# Patient Record
Sex: Male | Born: 1975 | Race: White | Hispanic: No | Smoking: Current every day smoker
Health system: Southern US, Community
[De-identification: ages and names within clinical notes are randomized; demographics above are authoritative.]

## PROBLEM LIST (undated history)

## (undated) DIAGNOSIS — Z720 Tobacco use: Secondary | ICD-10-CM

## (undated) DIAGNOSIS — F1910 Other psychoactive substance abuse, uncomplicated: Secondary | ICD-10-CM

## (undated) DIAGNOSIS — F1010 Alcohol abuse, uncomplicated: Secondary | ICD-10-CM

## (undated) DIAGNOSIS — J439 Emphysema, unspecified: Secondary | ICD-10-CM

## (undated) HISTORY — PX: HERNIA REPAIR: SHX51

---

## 2005-10-23 ENCOUNTER — Emergency Department: Payer: Self-pay | Admitting: Emergency Medicine

## 2008-08-01 ENCOUNTER — Ambulatory Visit: Payer: Self-pay | Admitting: Surgery

## 2008-08-07 ENCOUNTER — Ambulatory Visit: Payer: Self-pay | Admitting: Surgery

## 2014-01-25 ENCOUNTER — Emergency Department: Payer: Self-pay | Admitting: Student

## 2014-01-25 LAB — COMPREHENSIVE METABOLIC PANEL
ALK PHOS: 99 U/L
Albumin: 4.3 g/dL (ref 3.4–5.0)
Anion Gap: 10 (ref 7–16)
BUN: 9 mg/dL (ref 7–18)
Bilirubin,Total: 2.1 mg/dL — ABNORMAL HIGH (ref 0.2–1.0)
CALCIUM: 8.9 mg/dL (ref 8.5–10.1)
CHLORIDE: 102 mmol/L (ref 98–107)
Co2: 23 mmol/L (ref 21–32)
Creatinine: 0.74 mg/dL (ref 0.60–1.30)
EGFR (Non-African Amer.): >60
Glucose: 126 mg/dL — ABNORMAL HIGH (ref 65–99)
OSMOLALITY: 270 (ref 275–301)
POTASSIUM: 4.1 mmol/L (ref 3.5–5.1)
SGOT(AST): 125 U/L — ABNORMAL HIGH (ref 15–37)
SGPT (ALT): 197 U/L — ABNORMAL HIGH
SODIUM: 135 mmol/L — AB (ref 136–145)
TOTAL PROTEIN: 7.6 g/dL (ref 6.4–8.2)

## 2014-01-25 LAB — DRUG SCREEN, URINE
Amphetamines, Ur Screen: NEGATIVE (ref ?–1000)
BENZODIAZEPINE, UR SCRN: POSITIVE (ref ?–200)
Barbiturates, Ur Screen: NEGATIVE (ref ?–200)
Cannabinoid 50 Ng, Ur ~~LOC~~: POSITIVE (ref ?–50)
Cocaine Metabolite,Ur ~~LOC~~: NEGATIVE (ref ?–300)
MDMA (Ecstasy)Ur Screen: POSITIVE (ref ?–500)
METHADONE, UR SCREEN: NEGATIVE (ref ?–300)
OPIATE, UR SCREEN: NEGATIVE (ref ?–300)
Phencyclidine (PCP) Ur S: NEGATIVE (ref ?–25)
TRICYCLIC, UR SCREEN: NEGATIVE (ref ?–1000)

## 2014-01-25 LAB — SALICYLATE LEVEL: Salicylates, Serum: 3.4 mg/dL — ABNORMAL HIGH

## 2014-01-25 LAB — URINALYSIS, COMPLETE
BACTERIA: NONE SEEN
BILIRUBIN, UR: NEGATIVE
Blood: NEGATIVE
Glucose,UR: NEGATIVE mg/dL (ref 0–75)
LEUKOCYTE ESTERASE: NEGATIVE
Nitrite: NEGATIVE
PROTEIN: NEGATIVE
Ph: 7 (ref 4.5–8.0)
RBC, UR: NONE SEEN /HPF (ref 0–5)
Specific Gravity: 1.013 (ref 1.003–1.030)
WBC UR: NONE SEEN /HPF (ref 0–5)

## 2014-01-25 LAB — CBC
HCT: 45.3 % (ref 40.0–52.0)
HGB: 14.6 g/dL (ref 13.0–18.0)
MCH: 28.4 pg (ref 26.0–34.0)
MCHC: 32.2 g/dL (ref 32.0–36.0)
MCV: 88 fL (ref 80–100)
PLATELETS: 149 10*3/uL — AB (ref 150–440)
RBC: 5.13 10*6/uL (ref 4.40–5.90)
RDW: 14.5 % (ref 11.5–14.5)
WBC: 4.3 10*3/uL (ref 3.8–10.6)

## 2014-01-25 LAB — ETHANOL: Ethanol: <3 mg/dL

## 2014-01-25 LAB — ACETAMINOPHEN LEVEL: Acetaminophen: <2 ug/mL

## 2018-05-03 ENCOUNTER — Emergency Department: Payer: Self-pay

## 2018-05-03 ENCOUNTER — Inpatient Hospital Stay
Admission: EM | Admit: 2018-05-03 | Discharge: 2018-05-08 | DRG: 177 | Disposition: A | Discharge status: Home / Self Care | Payer: Self-pay | Attending: Specialist | Admitting: Specialist

## 2018-05-03 ENCOUNTER — Other Ambulatory Visit: Payer: Self-pay

## 2018-05-03 DIAGNOSIS — E43 Unspecified severe protein-calorie malnutrition: Secondary | ICD-10-CM | POA: Diagnosis present

## 2018-05-03 DIAGNOSIS — Z681 Body mass index (BMI) 19 or less, adult: Secondary | ICD-10-CM

## 2018-05-03 DIAGNOSIS — Z716 Tobacco abuse counseling: Secondary | ICD-10-CM

## 2018-05-03 DIAGNOSIS — Z23 Encounter for immunization: Secondary | ICD-10-CM

## 2018-05-03 DIAGNOSIS — F1290 Cannabis use, unspecified, uncomplicated: Secondary | ICD-10-CM | POA: Diagnosis present

## 2018-05-03 DIAGNOSIS — Z8249 Family history of ischemic heart disease and other diseases of the circulatory system: Secondary | ICD-10-CM

## 2018-05-03 DIAGNOSIS — E876 Hypokalemia: Secondary | ICD-10-CM | POA: Diagnosis present

## 2018-05-03 DIAGNOSIS — F10239 Alcohol dependence with withdrawal, unspecified: Secondary | ICD-10-CM | POA: Diagnosis present

## 2018-05-03 DIAGNOSIS — J189 Pneumonia, unspecified organism: Secondary | ICD-10-CM | POA: Diagnosis present

## 2018-05-03 DIAGNOSIS — F329 Major depressive disorder, single episode, unspecified: Secondary | ICD-10-CM | POA: Diagnosis present

## 2018-05-03 DIAGNOSIS — J690 Pneumonitis due to inhalation of food and vomit: Principal | ICD-10-CM | POA: Diagnosis present

## 2018-05-03 DIAGNOSIS — L89010 Pressure ulcer of right elbow, unstageable: Secondary | ICD-10-CM | POA: Diagnosis present

## 2018-05-03 DIAGNOSIS — F17200 Nicotine dependence, unspecified, uncomplicated: Secondary | ICD-10-CM | POA: Diagnosis present

## 2018-05-03 DIAGNOSIS — Z833 Family history of diabetes mellitus: Secondary | ICD-10-CM

## 2018-05-03 DIAGNOSIS — L8990 Pressure ulcer of unspecified site, unspecified stage: Secondary | ICD-10-CM

## 2018-05-03 DIAGNOSIS — D649 Anemia, unspecified: Secondary | ICD-10-CM | POA: Diagnosis present

## 2018-05-03 HISTORY — DX: Tobacco use: Z72.0

## 2018-05-03 HISTORY — DX: Alcohol abuse, uncomplicated: F10.10

## 2018-05-03 HISTORY — DX: Other psychoactive substance abuse, uncomplicated: F19.10

## 2018-05-03 LAB — URINALYSIS, COMPLETE (UACMP) WITH MICROSCOPIC
Bacteria, UA: NONE SEEN
Bilirubin Urine: NEGATIVE
Glucose, UA: NEGATIVE mg/dL
Hgb urine dipstick: NEGATIVE
KETONES UR: NEGATIVE mg/dL
LEUKOCYTES UA: NEGATIVE
Nitrite: NEGATIVE
Protein, ur: NEGATIVE mg/dL
Specific Gravity, Urine: 1.011 (ref 1.005–1.030)
pH: 7 (ref 5.0–8.0)

## 2018-05-03 LAB — COMPREHENSIVE METABOLIC PANEL
ALBUMIN: 3 g/dL — AB (ref 3.5–5.0)
ALT: 22 U/L (ref 0–44)
AST: 29 U/L (ref 15–41)
Alkaline Phosphatase: 95 U/L (ref 38–126)
Anion gap: 13 (ref 5–15)
BUN: <5 mg/dL — ABNORMAL LOW (ref 6–20)
CO2: 28 mmol/L (ref 22–32)
Calcium: 8.5 mg/dL — ABNORMAL LOW (ref 8.9–10.3)
Chloride: 88 mmol/L — ABNORMAL LOW (ref 98–111)
Creatinine, Ser: 0.61 mg/dL (ref 0.61–1.24)
GFR calc Af Amer: >60 mL/min (ref >60)
GFR calc non Af Amer: >60 mL/min (ref >60)
Glucose, Bld: 121 mg/dL — ABNORMAL HIGH (ref 70–99)
Potassium: 3.6 mmol/L (ref 3.5–5.1)
Sodium: 129 mmol/L — ABNORMAL LOW (ref 135–145)
Total Bilirubin: 1.6 mg/dL — ABNORMAL HIGH (ref 0.3–1.2)
Total Protein: 7.1 g/dL (ref 6.5–8.1)

## 2018-05-03 LAB — URINE DRUG SCREEN, QUALITATIVE (ARMC ONLY)
AMPHETAMINES, UR SCREEN: NOT DETECTED
Barbiturates, Ur Screen: NOT DETECTED
Benzodiazepine, Ur Scrn: NOT DETECTED
Cannabinoid 50 Ng, Ur ~~LOC~~: NOT DETECTED
Cocaine Metabolite,Ur ~~LOC~~: NOT DETECTED
MDMA (Ecstasy)Ur Screen: NOT DETECTED
Methadone Scn, Ur: NOT DETECTED
Opiate, Ur Screen: NOT DETECTED
Phencyclidine (PCP) Ur S: NOT DETECTED
Tricyclic, Ur Screen: NOT DETECTED

## 2018-05-03 LAB — TSH: TSH: 3.008 u[IU]/mL (ref 0.350–4.500)

## 2018-05-03 LAB — CBC
HCT: 37.1 % — ABNORMAL LOW (ref 39.0–52.0)
Hemoglobin: 12.5 g/dL — ABNORMAL LOW (ref 13.0–17.0)
MCH: 28.9 pg (ref 26.0–34.0)
MCHC: 33.7 g/dL (ref 30.0–36.0)
MCV: 85.7 fL (ref 80.0–100.0)
Platelets: 615 10*3/uL — ABNORMAL HIGH (ref 150–400)
RBC: 4.33 MIL/uL (ref 4.22–5.81)
RDW: 13.5 % (ref 11.5–15.5)
WBC: 12.1 10*3/uL — ABNORMAL HIGH (ref 4.0–10.5)
nRBC: 0 % (ref 0.0–0.2)

## 2018-05-03 LAB — CG4 I-STAT (LACTIC ACID)
Lactic Acid, Venous: 3.49 mmol/L (ref 0.5–1.9)
Lactic Acid, Venous: 2.03 mmol/L (ref 0.5–1.9)

## 2018-05-03 LAB — MRSA PCR SCREENING: MRSA by PCR: NEGATIVE

## 2018-05-03 LAB — TROPONIN I: Troponin I: <0.03 ng/mL (ref <0.03)

## 2018-05-03 LAB — VITAMIN B12: Vitamin B-12: 1409 pg/mL — ABNORMAL HIGH (ref 180–914)

## 2018-05-03 LAB — PROCALCITONIN: Procalcitonin: 2.67 ng/mL

## 2018-05-03 MED ORDER — SODIUM CHLORIDE 0.9 % IV SOLN: 3.0000 g | Freq: Once | INTRAVENOUS | Status: DC

## 2018-05-03 MED ORDER — LORAZEPAM 2 MG/ML IJ SOLN: 0.0000 mg | Freq: Four times a day (QID) | INTRAMUSCULAR | Status: DC

## 2018-05-03 MED ORDER — THIAMINE HCL 100 MG/ML IJ SOLN: 100.0000 mg | Freq: Every day | INTRAMUSCULAR | Status: DC

## 2018-05-03 MED ORDER — VANCOMYCIN HCL IN DEXTROSE 1-5 GM/200ML-% IV SOLN
1000.0000 mg | Freq: Two times a day (BID) | INTRAVENOUS | Status: DC
  Administered 2018-05-04: 03:00:00 1000 mg via INTRAVENOUS
  Filled 2018-05-03 (×2): qty 200

## 2018-05-03 MED ORDER — LORAZEPAM 2 MG/ML IJ SOLN: 0.0000 mg | Freq: Two times a day (BID) | INTRAMUSCULAR | Status: DC

## 2018-05-03 MED ORDER — DOCUSATE SODIUM 100 MG PO CAPS
100.0000 mg | ORAL_CAPSULE | Freq: Two times a day (BID) | ORAL | Status: DC
  Administered 2018-05-04 – 2018-05-08 (×9): 100 mg via ORAL
  Filled 2018-05-03 (×9): qty 1

## 2018-05-03 MED ORDER — KETOROLAC TROMETHAMINE 30 MG/ML IJ SOLN
30.0000 mg | Freq: Four times a day (QID) | INTRAMUSCULAR | Status: DC | PRN
  Administered 2018-05-05 (×2): 30 mg via INTRAVENOUS
  Filled 2018-05-03 (×2): qty 1

## 2018-05-03 MED ORDER — ZOLPIDEM TARTRATE 5 MG PO TABS
5.0000 mg | ORAL_TABLET | Freq: Every evening | ORAL | Status: DC | PRN
  Administered 2018-05-03 – 2018-05-06 (×4): 5 mg via ORAL
  Filled 2018-05-03 (×5): qty 1

## 2018-05-03 MED ORDER — IOHEXOL 300 MG/ML  SOLN
75.0000 mL | Freq: Once | INTRAMUSCULAR | Status: AC | PRN
  Administered 2018-05-03: 75 mL via INTRAVENOUS

## 2018-05-03 MED ORDER — SODIUM CHLORIDE 0.9 % IV SOLN
INTRAVENOUS | Status: DC
  Administered 2018-05-03 – 2018-05-05 (×4): via INTRAVENOUS

## 2018-05-03 MED ORDER — ENOXAPARIN SODIUM 40 MG/0.4ML ~~LOC~~ SOLN
40.0000 mg | SUBCUTANEOUS | Status: DC
  Administered 2018-05-04 – 2018-05-07 (×4): 40 mg via SUBCUTANEOUS
  Filled 2018-05-03 (×4): qty 0.4

## 2018-05-03 MED ORDER — LORAZEPAM 2 MG PO TABS
0.0000 mg | ORAL_TABLET | Freq: Four times a day (QID) | ORAL | Status: DC
  Administered 2018-05-03: 1 mg via ORAL

## 2018-05-03 MED ORDER — LORAZEPAM 1 MG PO TABS
1.0000 mg | ORAL_TABLET | Freq: Once | ORAL | Status: DC
  Filled 2018-05-03: qty 1

## 2018-05-03 MED ORDER — ADULT MULTIVITAMIN W/MINERALS CH
1.0000 | ORAL_TABLET | Freq: Every day | ORAL | Status: DC
  Administered 2018-05-03 – 2018-05-08 (×6): 1 via ORAL
  Filled 2018-05-03 (×6): qty 1

## 2018-05-03 MED ORDER — SODIUM CHLORIDE 0.9 % IV SOLN
500.0000 mg | Freq: Once | INTRAVENOUS | Status: AC
  Administered 2018-05-03: 500 mg via INTRAVENOUS
  Filled 2018-05-03: qty 500

## 2018-05-03 MED ORDER — VITAMIN B-1 100 MG PO TABS
100.0000 mg | ORAL_TABLET | Freq: Every day | ORAL | Status: DC
  Administered 2018-05-03: 100 mg via ORAL
  Filled 2018-05-03: qty 1

## 2018-05-03 MED ORDER — VITAMIN B-1 100 MG PO TABS
100.0000 mg | ORAL_TABLET | Freq: Every day | ORAL | Status: DC
  Administered 2018-05-03 – 2018-05-08 (×6): 100 mg via ORAL
  Filled 2018-05-03 (×6): qty 1

## 2018-05-03 MED ORDER — LORAZEPAM 1 MG PO TABS
1.0000 mg | ORAL_TABLET | Freq: Four times a day (QID) | ORAL | Status: AC | PRN
  Administered 2018-05-03: 20:00:00 1 mg via ORAL
  Filled 2018-05-03: qty 1

## 2018-05-03 MED ORDER — ONDANSETRON HCL 4 MG/2ML IJ SOLN: 4.0000 mg | Freq: Four times a day (QID) | INTRAMUSCULAR | Status: DC | PRN

## 2018-05-03 MED ORDER — ONDANSETRON HCL 4 MG PO TABS: 4.0000 mg | ORAL_TABLET | Freq: Four times a day (QID) | ORAL | Status: DC | PRN

## 2018-05-03 MED ORDER — ACETAMINOPHEN 650 MG RE SUPP: 650.0000 mg | Freq: Four times a day (QID) | RECTAL | Status: DC | PRN

## 2018-05-03 MED ORDER — VANCOMYCIN HCL 10 G IV SOLR
1250.0000 mg | Freq: Once | INTRAVENOUS | Status: AC
  Administered 2018-05-03: 1250 mg via INTRAVENOUS
  Filled 2018-05-03: qty 1250

## 2018-05-03 MED ORDER — NICOTINE 21 MG/24HR TD PT24
21.0000 mg | MEDICATED_PATCH | Freq: Once | TRANSDERMAL | Status: AC
  Administered 2018-05-03: 21 mg via TRANSDERMAL

## 2018-05-03 MED ORDER — FOLIC ACID 1 MG PO TABS
1.0000 mg | ORAL_TABLET | Freq: Every day | ORAL | Status: DC
  Administered 2018-05-03 – 2018-05-08 (×6): 1 mg via ORAL
  Filled 2018-05-03 (×6): qty 1

## 2018-05-03 MED ORDER — SODIUM CHLORIDE 0.9 % IV BOLUS
1000.0000 mL | Freq: Once | INTRAVENOUS | Status: AC
  Administered 2018-05-03: 1000 mL via INTRAVENOUS

## 2018-05-03 MED ORDER — LORAZEPAM 2 MG/ML IJ SOLN
0.0000 mg | Freq: Four times a day (QID) | INTRAMUSCULAR | Status: AC
  Administered 2018-05-04: 06:00:00 2 mg via INTRAVENOUS
  Administered 2018-05-05: 1 mg via INTRAVENOUS
  Filled 2018-05-03 (×3): qty 1

## 2018-05-03 MED ORDER — TRAMADOL HCL 50 MG PO TABS: 50.0000 mg | ORAL_TABLET | Freq: Four times a day (QID) | ORAL | Status: DC | PRN

## 2018-05-03 MED ORDER — ALBUTEROL SULFATE (2.5 MG/3ML) 0.083% IN NEBU
5.0000 mg | INHALATION_SOLUTION | Freq: Once | RESPIRATORY_TRACT | Status: AC
  Administered 2018-05-03: 5 mg via RESPIRATORY_TRACT
  Filled 2018-05-03: qty 6

## 2018-05-03 MED ORDER — LORAZEPAM 2 MG PO TABS: 0.0000 mg | ORAL_TABLET | Freq: Two times a day (BID) | ORAL | Status: DC

## 2018-05-03 MED ORDER — LORAZEPAM 2 MG/ML IJ SOLN
1.0000 mg | Freq: Four times a day (QID) | INTRAMUSCULAR | Status: AC | PRN
  Administered 2018-05-05 – 2018-05-06 (×4): 1 mg via INTRAVENOUS
  Filled 2018-05-03 (×4): qty 1

## 2018-05-03 MED ORDER — ACETAMINOPHEN 325 MG PO TABS
650.0000 mg | ORAL_TABLET | Freq: Four times a day (QID) | ORAL | Status: DC | PRN
  Administered 2018-05-04: 16:00:00 650 mg via ORAL
  Filled 2018-05-03: qty 2

## 2018-05-03 MED ORDER — LORAZEPAM 2 MG/ML IJ SOLN
0.0000 mg | Freq: Two times a day (BID) | INTRAMUSCULAR | Status: AC
  Administered 2018-05-05 – 2018-05-07 (×3): 2 mg via INTRAVENOUS
  Administered 2018-05-07: 1 mg via INTRAVENOUS
  Filled 2018-05-03 (×2): qty 1
  Filled 2018-05-03: qty 2

## 2018-05-03 MED ORDER — SODIUM CHLORIDE 0.9 % IV SOLN
1.0000 g | Freq: Once | INTRAVENOUS | Status: AC
  Administered 2018-05-03: 1 g via INTRAVENOUS
  Filled 2018-05-03: qty 10

## 2018-05-03 MED ORDER — SODIUM CHLORIDE 0.9 % IV SOLN
3.0000 g | Freq: Four times a day (QID) | INTRAVENOUS | Status: DC
  Administered 2018-05-03 – 2018-05-08 (×19): 3 g via INTRAVENOUS
  Filled 2018-05-03 (×25): qty 3

## 2018-05-03 MED ORDER — POLYETHYLENE GLYCOL 3350 17 G PO PACK: 17.0000 g | PACK | Freq: Every day | ORAL | Status: DC | PRN

## 2018-05-03 MED ORDER — THIAMINE HCL 100 MG/ML IJ SOLN
100.0000 mg | Freq: Every day | INTRAMUSCULAR | Status: DC
  Filled 2018-05-03 (×6): qty 1

## 2018-05-03 NOTE — ED Triage Notes (Addendum)
Pt c/o cough for the past 2 weeks with grayish/green sputum . States in the past couple of days is very SOB, worse with exertion, also with at rest. Pt is tachypneic in triage.  Pt states he drinks 6-12 beers/day and is having mild tremors noted in triage, last drink was last night.

## 2018-05-03 NOTE — H&P (Signed)
[facility] [place] [facility]   PATIENT NAME: Shaun ParishJeremy Behe    MR#:  [id number]  DATE OF BIRTH:  [birth year]  DATE OF ADMISSION:  05/03/2018  PRIMARY CARE PHYSICIAN: Patient, No Pcp Per   REQUESTING/REFERRING PHYSICIAN: Dr. Minna AntisKevin Paduchowski  CHIEF COMPLAINT:   Chief Complaint  Patient presents with  . Shortness of Breath    HISTORY OF PRESENT ILLNESS:  Shaun Ward  is a 43 y.o. male with a known history of smoking, alcohol abuse and use of marijuana presents to hospital secondary to worsening shortness of breath for 2 days now. Patient states he has had on and off cough sometimes productive for almost a year now.  Occasionally complains of night sweats and chills but no recorded fevers.  Gradually he noticed that his breathing was getting more short of breath in the last couple of days.  This morning he felt like he could not breathe and so presented to the emergency room.  He denies any IV drug abuse.  Uses marijuana occasionally.  Does drink alcohol quite a bit and is concerned about withdrawals as well.  He was in prison several years ago, no recent exposure to anybody with tuberculosis.  No travel.  CT of the chest here showing widespread pneumonia possible cavitary lesion in the right upper lobe.  He is being admitted for multilobar pneumonia.  PAST MEDICAL HISTORY:   Past Medical History:  Diagnosis Date  . Alcohol abuse   . Polysubstance abuse (HCC)   . Tobacco abuse     PAST SURGICAL HISTORY:   Past Surgical History:  Procedure Laterality Date  . HERNIA REPAIR      SOCIAL HISTORY:   Social History   Tobacco Use  . Smoking status: Current Every Day Smoker    Packs/day: 1.50    Types: Cigarettes  . Smokeless tobacco: Never Used  Substance Use Topics  . Alcohol use: Yes    Alcohol/week: 6.0 - 12.0 standard drinks    Types: 6 - 12 Cans of beer per week    Comment: daily    FAMILY HISTORY:   Family History  Problem Relation Age of  Onset  . Diabetes Father   . CAD Paternal Grandmother     DRUG ALLERGIES:  No Known Allergies  REVIEW OF SYSTEMS:   Review of Systems  Constitutional: Positive for chills, malaise/fatigue and weight loss. Negative for fever.  HENT: Negative for ear discharge, ear pain, hearing loss, nosebleeds and tinnitus.   Eyes: Negative for blurred vision, double vision and photophobia.  Respiratory: Positive for cough and shortness of breath. Negative for hemoptysis and wheezing.   Cardiovascular: Negative for chest pain, palpitations, orthopnea and leg swelling.  Gastrointestinal: Positive for nausea. Negative for abdominal pain, constipation, diarrhea, heartburn, melena and vomiting.  Genitourinary: Negative for dysuria, frequency, hematuria and urgency.  Musculoskeletal: Positive for myalgias. Negative for back pain and neck pain.  Skin: Negative for rash.  Neurological: Negative for dizziness, tingling, tremors, sensory change, speech change, focal weakness and headaches.  Endo/Heme/Allergies: Does not bruise/bleed easily.  Psychiatric/Behavioral: Negative for depression.    MEDICATIONS AT HOME:   Prior to Admission medications   Not on File      VITAL SIGNS:  Blood pressure 124/78, pulse (!) 110, temperature 98.5 F (36.9 C), temperature source Oral, resp. rate (!) 26, height 5\' 7"  (1.702 m), weight 54.4 kg, SpO2 95 %.  PHYSICAL EXAMINATION:   Physical Exam  GENERAL:  43 y.o.-year-old thin built  and ill nourished patient lying in the bed with no acute distress.  EYES: Pupils equal, round, reactive to light and accommodation. No scleral icterus. Extraocular muscles intact.  HEENT: Head atraumatic, normocephalic. Oropharynx and nasopharynx clear.  NECK:  Supple, no jugular venous distention. No thyroid enlargement, no tenderness.  No lymphadenopathy.   LUNGS: Normal breath sounds bilaterally, no wheezing, rales or crepitation. No use of accessory muscles of respiration.  Scattered  rhonchi and decreased basilar breath sounds. CARDIOVASCULAR: S1, S2 normal. No murmurs, rubs, or gallops.  ABDOMEN: Soft, nontender, nondistended. Bowel sounds present. No organomegaly or mass.  EXTREMITIES: No pedal edema, cyanosis, or clubbing.  NEUROLOGIC: Cranial nerves II through XII are intact. Muscle strength 5/5 in all extremities. Sensation intact. Gait not checked.  PSYCHIATRIC: The patient is alert and oriented x 3.  SKIN: No obvious rash, lesion, or ulcer.   LABORATORY PANEL:   CBC Recent Labs  Lab 05/03/18 0907  WBC 12.1*  HGB 12.5*  HCT 37.1*  PLT 615*   ------------------------------------------------------------------------------------------------------------------  Chemistries  Recent Labs  Lab 05/03/18 0907  NA 129*  K 3.6  CL 88*  CO2 28  GLUCOSE 121*  BUN <5*  CREATININE 0.61  CALCIUM 8.5*  AST 29  ALT 22  ALKPHOS 95  BILITOT 1.6*   ------------------------------------------------------------------------------------------------------------------  Cardiac Enzymes Recent Labs  Lab 05/03/18 0907  TROPONINI <0.03   ------------------------------------------------------------------------------------------------------------------  RADIOLOGY:  Dg Chest 2 View  Result Date: 05/03/2018 CLINICAL DATA:  Productive cough 2 weeks. EXAM: CHEST - 2 VIEW COMPARISON:  None. FINDINGS: The lungs are emphysematous. There is a focal nodule in the anterior left upper lobe which measures 2.6 cm in diameter. Peribronchial thickening is present. No consolidative process. No pneumothorax or effusion. Heart size is normal. 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 IMPRESSION: Nodular opacity in the anterior left upper lobe. CT of the chest with contrast is recommended to evaluate for carcinoma. COPD. Electronically Signed   By: Drusilla Kanner M.D.   On: 05/03/2018 09:43   Ct Chest W Contrast  Result Date:  05/03/2018 CLINICAL DATA:  Productive cough for 2 weeks. Nodular upper left lung opacity on chest radiograph. Dyspnea. Current smoker. EXAM: CT CHEST WITH CONTRAST TECHNIQUE: Multidetector CT imaging of the chest was performed during intravenous contrast administration. CONTRAST:  64mL OMNIPAQUE IOHEXOL 300 MG/ML  SOLN COMPARISON:  Chest radiograph from earlier today. FINDINGS: Cardiovascular: Normal heart size. No significant pericardial effusion/thickening. Left anterior descending coronary atherosclerosis. Great vessels are normal in course and caliber. No central pulmonary emboli. Mediastinum/Nodes: No discrete thyroid nodules. Unremarkable esophagus. No pathologically enlarged axillary, mediastinal or hilar lymph nodes. Lungs/Pleura: No pneumothorax. No pleural effusion. Mild centrilobular and paraseptal emphysema with diffuse bronchial wall thickening. There is widespread patchy nodular peribronchovascular consolidation and tree-in-bud opacity throughout both lungs involving all lung lobes, with scattered small areas of cavitation at the areas of nodular peribronchovascular consolidation in both lung. Dominant poorly marginated 4.5 x 4.0 cm focus of peribronchovascular consolidation in the anterior left upper lobe (series 3/image 57) with partial internal cavitary change. Poorly marginated cavitary 1.5 x 1.0 cm right upper lobe peribronchovascular nodular focus of consolidation (series 3/image 42). Upper abdomen: No acute abnormality. Musculoskeletal:  No aggressive appearing focal osseous lesions. IMPRESSION: 1. Widespread patchy nodular peribronchovascular consolidation with scattered cavitary change and tree-in-bud opacity throughout both lungs involving all lung lobes. The largest nodular foci of cavitary consolidation are in the upper lobes bilaterally as detailed.  Multilobar infectious bronchopneumonia is suspected, with atypical etiologies including tuberculosis or MAI not excluded. Pulmonology  consultation suggested. Short-term follow-up post treatment chest CT will be required in 3 months to document resolution of these findings and to exclude underlying neoplasm in this high risk patient. 2. Mild centrilobular and paraseptal emphysema with diffuse bronchial wall thickening, suggesting COPD. 3. One vessel coronary atherosclerosis. Emphysema (ICD10-J43.9). Electronically Signed   By: Delbert Phenix M.D.   On: 05/03/2018 11:07    EKG:   Orders placed or performed during the hospital encounter of 05/03/18  . EKG 12-Lead  . EKG 12-Lead  . ED EKG  . ED EKG    IMPRESSION AND PLAN:   Dazhon Newitt  is a 43 y.o. male with a known history of smoking, alcohol abuse and use of marijuana presents to hospital secondary to worsening shortness of breath for 2 days now.  1.  Multilobar bronchopneumonia-also having cavitary lesion in the upper lobes.  With use of alcohol and drugs. -Airborne isolation until AFB x3 is ruled out -Blood cultures are pending.  Also order sputum cultures -MRSA PCR.  Currently on vancomycin and Unasyn. -Not requiring supplemental oxygen for now.  Continue to monitor at this time -Check HIV  2.  Alcohol abuse-started on CIWA protocol  3.  Tobacco use disorder-counseled, started on nicotine patch  4.  Marijuana abuse-check a urine drug screen.  Strongly counseled  5.  DVT prophylaxis-Lovenox    All the records are reviewed and case discussed with ED provider. Management plans discussed with the patient, family and they are in agreement.  CODE STATUS: Full code  TOTAL TIME TAKING CARE OF THIS PATIENT: 55 minutes.    Enid Baas M.D on 05/03/2018 at 12:36 PM  Between 7am to 6pm - Pager - [phone]  After 6pm go to [email or web address] - password [email or web address]  [facility]  Office  [phone]  CC: Primary care physician; Patient, No Pcp Per

## 2018-05-03 NOTE — ED Provider Notes (Addendum)
[facility] Emergency Department Provider Note  Time seen: 9:37 AM  I have reviewed the triage vital signs and the nursing notes.   HISTORY  Chief Complaint Shortness of Breath    HPI Shaun Ward is a 43 y.o. male with no significant past medical history presents to the emergency department for cough and congestion.  According to the patient for the past 2 to 3 weeks he has been coughing occasional yellow sputum production.  States has been having increased trouble breathing over the past 2 to 3 days.  States wheeze at home.  Denies any chest pain or abdominal pain.  Patient does admit to 12+ beers daily per patient and father.  Father is asked in private if they can send him to detox, however at this time patient is refusing detox, states he does not need it.  History reviewed. No pertinent past medical history.  There are no active problems to display for this patient.   Past Surgical History:  Procedure Laterality Date  . HERNIA REPAIR      Prior to Admission medications   Not on File    No Known Allergies  No family history on file.  Social History Social History   Tobacco Use  . Smoking status: Current Every Day Smoker    Types: Cigarettes  . Smokeless tobacco: Never Used  Substance Use Topics  . Alcohol use: Yes    Alcohol/week: 6.0 - 12.0 standard drinks    Types: 6 - 12 Cans of beer per week    Comment: daily  . Drug use: Not Currently    Review of Systems Constitutional: Negative for fever. Cardiovascular: Negative for chest pain. Respiratory: Positive shortness of breath.  Positive for cough with occasional sputum production. Gastrointestinal: Negative for abdominal pain, vomiting  Musculoskeletal: Negative for leg pain or swelling. Skin: Negative for skin complaints  Neurological: Negative for headache All other ROS negative  ____________________________________________   PHYSICAL EXAM:  VITAL SIGNS: ED Triage  Vitals  Enc Vitals Group     BP 05/03/18 0855 124/78     Pulse Rate 05/03/18 0855 (!) 110     Resp 05/03/18 0855 (!) 26     Temp 05/03/18 0855 98.5 F (36.9 C)     Temp Source 05/03/18 0855 Oral     SpO2 05/03/18 0855 95 %     Weight 05/03/18 0856 120 lb (54.4 kg)     Height 05/03/18 0856 5\' 7"  (1.702 m)     Head Circumference --      Peak Flow --      Pain Score 05/03/18 0856 0     Pain Loc --      Pain Edu? --      Excl. in GC? --    Constitutional: Alert and oriented. Well appearing and in no distress. Eyes: Normal exam ENT   Head: Normocephalic and atraumatic.   Mouth/Throat: Mucous membranes are moist. Cardiovascular: Normal rate, regular rhythm around 100 bpm.  No murmur. Respiratory: Mild tachypnea.  Mild lower rhonchi auscultated bilaterally.  No wheeze however auscultation is status post DuoNeb's. Gastrointestinal: Soft and nontender. No distention.   Musculoskeletal: Nontender with normal range of motion in all extremities. No lower extremity tenderness or edema. Neurologic:  Normal speech and language. No gross focal neurologic deficits  Skin:  Skin is warm, dry and intact.  Psychiatric: Mood and affect are normal.   ____________________________________________    EKG  EKG viewed and interpreted by myself shows  sinus tachycardia 108 bpm with a narrow QRS, normal axis, normal intervals, no concerning ST changes.  ____________________________________________    RADIOLOGY  CT scan of the chest shows multi lobar pneumonia possible atypical infection such as TB not excluded.  ____________________________________________   INITIAL IMPRESSION / ASSESSMENT AND PLAN / ED COURSE  Pertinent labs & imaging results that were available during my care of the patient were reviewed by me and considered in my medical decision making (see chart for details).  Patient presents to the emergency department for cough congestion and shortness of breath ongoing for the  past 2 to 3 weeks but shortness of breath worsening over the past 3 to 4 days.  No chest pain.  Patient does drink a substantial amount of alcohol greater than 12 beers per day per dad, I discussed this with the patient and offered detox placement, patient is adamantly against detox at this time.  Father states in private that he was hoping he could get the patient into detox against as well, I discussed with the father that detox is purely on a voluntary basis.  We will continue to monitor the patient.  Differential this time would include pneumonia, reactive airway disease, bronchitis.  We will check labs including cardiac enzymes.  EKG shows mild tachycardia, patient does have a mild tremor states his last alcoholic drink was last night.  CT scan of the chest is more consistent multifocal pneumonia with several cavitary lesions, TB not excluded.  Patient states several years ago he did have to go to jail for several weekends but no prolonged confinement.  We will check an interferon gold test.  We will start broad-spectrum antibiotics.  Blood cultures will be sent and the patient will be admitted to the hospital service for further treatment.  CRITICAL CARE Performed by: Minna Antis   Total critical care time: 30 minutes  Critical care time was exclusive of separately billable procedures and treating other patients.  Critical care was necessary to treat or prevent imminent or life-threatening deterioration.  Critical care was time spent personally by me on the following activities: development of treatment plan with patient and/or surrogate as well as nursing, discussions with consultants, evaluation of patient's response to treatment, examination of patient, obtaining history from patient or surrogate, ordering and performing treatments and interventions, ordering and review of laboratory studies, ordering and review of radiographic studies, pulse oximetry and re-evaluation of patient's  condition.  ____________________________________________   FINAL CLINICAL IMPRESSION(S) / ED DIAGNOSES  Cough Upper respiratory infection Community-acquired pneumonia   Minna Antis, MD 05/03/18 1120    Minna Antis, MD 05/30/18 [phone]

## 2018-05-03 NOTE — Consult Note (Signed)
Pharmacy Antibiotic Note  Shaun Ward is a 43 y.o. male admitted on 05/03/2018 with aspiration pneumonia.  Pharmacy has been consulted for Unasyn and vancomycin dosing.  Plan: Unasyn 3 gm every 6 hours  Loading dose: Vancomycin 1250 mg IV once. Vancomycin 1000 mg IV Q 12 hrs. Goal AUC 400-550. Expected AUC: 485.6 SCr used: 0.61  Height: 5\' 7"  (170.2 cm) Weight: 120 lb (54.4 kg) IBW/kg (Calculated) : 66.1  Temp (24hrs), Avg:98.5 F (36.9 C), Min:98.5 F (36.9 C), Max:98.5 F (36.9 C)  Recent Labs  Lab 05/03/18 0907 05/03/18 1310  WBC 12.1*  --   CREATININE 0.61  --   LATICACIDVEN  --  3.49*    Estimated Creatinine Clearance: 92.6 mL/min (by C-G formula based on SCr of 0.61 mg/dL).    No Known Allergies  Antimicrobials this admission: 1/13 Azithromycin/CTX x 1 dose each in ED Unasyn 1/13 >>  Vanco 1/13 >>  Dose adjustments this admission:   Microbiology results: 1/13 BCx: pending  UCx:    Sputum:   1/13 MRSA PCR: pending 1/13 Procalcitonin: pending  Thank you for allowing pharmacy to be a part of this patient's care.  Orinda Kenner, PharmD Clinical Pharmacist 05/03/2018 1:19 PM

## 2018-05-03 NOTE — ED Notes (Signed)
Istat lactic acid 3.49, Dr Lenard Lance notified.

## 2018-05-04 DIAGNOSIS — F339 Major depressive disorder, recurrent, unspecified: Secondary | ICD-10-CM

## 2018-05-04 DIAGNOSIS — F22 Delusional disorders: Secondary | ICD-10-CM

## 2018-05-04 DIAGNOSIS — K0889 Other specified disorders of teeth and supporting structures: Secondary | ICD-10-CM

## 2018-05-04 DIAGNOSIS — L8990 Pressure ulcer of unspecified site, unspecified stage: Secondary | ICD-10-CM

## 2018-05-04 DIAGNOSIS — F419 Anxiety disorder, unspecified: Secondary | ICD-10-CM

## 2018-05-04 LAB — CBC
HCT: 29.4 % — ABNORMAL LOW (ref 39.0–52.0)
Hemoglobin: 9.9 g/dL — ABNORMAL LOW (ref 13.0–17.0)
MCH: 29.3 pg (ref 26.0–34.0)
MCHC: 33.7 g/dL (ref 30.0–36.0)
MCV: 87 fL (ref 80.0–100.0)
NRBC: 0 % (ref 0.0–0.2)
Platelets: 462 10*3/uL — ABNORMAL HIGH (ref 150–400)
RBC: 3.38 MIL/uL — AB (ref 4.22–5.81)
RDW: 14 % (ref 11.5–15.5)
WBC: 7.8 10*3/uL (ref 4.0–10.5)

## 2018-05-04 LAB — MAGNESIUM: Magnesium: 1.9 mg/dL (ref 1.7–2.4)

## 2018-05-04 LAB — BASIC METABOLIC PANEL
Anion gap: 8 (ref 5–15)
BUN: <5 mg/dL — ABNORMAL LOW (ref 6–20)
CO2: 26 mmol/L (ref 22–32)
Calcium: 7.6 mg/dL — ABNORMAL LOW (ref 8.9–10.3)
Chloride: 99 mmol/L (ref 98–111)
Creatinine, Ser: 0.48 mg/dL — ABNORMAL LOW (ref 0.61–1.24)
GFR calc Af Amer: >60 mL/min (ref >60)
GFR calc non Af Amer: >60 mL/min (ref >60)
Glucose, Bld: 93 mg/dL (ref 70–99)
Potassium: 3.2 mmol/L — ABNORMAL LOW (ref 3.5–5.1)
Sodium: 133 mmol/L — ABNORMAL LOW (ref 135–145)

## 2018-05-04 LAB — EXPECTORATED SPUTUM ASSESSMENT W GRAM STAIN, RFLX TO RESP C: Special Requests: NORMAL

## 2018-05-04 LAB — PROCALCITONIN: Procalcitonin: 2.67 ng/mL

## 2018-05-04 MED ORDER — POTASSIUM CHLORIDE CRYS ER 20 MEQ PO TBCR
40.0000 meq | EXTENDED_RELEASE_TABLET | Freq: Two times a day (BID) | ORAL | Status: DC
  Administered 2018-05-04: 13:00:00 40 meq via ORAL
  Filled 2018-05-04 (×2): qty 2

## 2018-05-04 MED ORDER — POTASSIUM CHLORIDE CRYS ER 20 MEQ PO TBCR
40.0000 meq | EXTENDED_RELEASE_TABLET | Freq: Two times a day (BID) | ORAL | Status: AC
  Administered 2018-05-04: 40 meq via ORAL
  Filled 2018-05-04: qty 2

## 2018-05-04 MED ORDER — LORAZEPAM 2 MG/ML IJ SOLN
0.5000 mg | Freq: Once | INTRAMUSCULAR | Status: AC
  Administered 2018-05-06: 23:00:00 0.5 mg via INTRAVENOUS
  Filled 2018-05-04: qty 1

## 2018-05-04 MED ORDER — NICOTINE 21 MG/24HR TD PT24
21.0000 mg | MEDICATED_PATCH | Freq: Every day | TRANSDERMAL | Status: DC
  Administered 2018-05-04 – 2018-05-08 (×5): 21 mg via TRANSDERMAL
  Filled 2018-05-04 (×5): qty 1

## 2018-05-04 NOTE — Consult Note (Signed)
NAME: Shaun Ward  DOB: December 11, 1975  MRN: [id number]  Date/Time: 05/04/2018 2:40 PM [place] Subjective:  REASON FOR CONSULT: Cavitary pneumonia ? Shaun Ward is a 43 y.o. male with a history of alcohol abuse presents with cough of few months duration, shortness of breath of 3 days duration, and night sweats.  As per patient he has had a cough for a few months now .  He will cough up sometimes grayish-green sputum.  The past 3 days he has had increasing shortness of breath well generally and hence came to the hospital.  He drinks 6 to 12 cans of beer every day and states that he would literally fall sleep while drinking beer.  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 His mom was in the hospital in October 2019 and she also had similar issue of bilateral infection in the lungs with empyema.  She was referred to [facility] for further management at that time.  Patient lives with his mom and his daughter. Patient has no contact with known tuberculosis.  Patient does not remember aspirating food. He is a current smoker He has lost significant weight. No travel history Used to live in [place] until the age of 51 No recent steroid use No antibiotic use. He states he has tremors in his hands and alcohol makes it better Past Medical History:  Diagnosis Date  . Alcohol abuse   . Polysubstance abuse ([place])   . Tobacco abuse     Past Surgical History:  Procedure Laterality Date  . HERNIA REPAIR    Social history Current smoker Says alcohol use Denies using heroin or cocaine Occasional use of marijuana No vaping Family History  Problem Relation Age of Onset  . Diabetes Father   . CAD Paternal Grandmother    No Known Allergies  ? Current Facility-Administered Medications  Medication Dose Route Frequency Provider Last Rate Last Dose  . 0.9 %  sodium chloride infusion   Intravenous Continuous Gladstone Lighter, MD 75 mL/hr at  05/04/18 1056    . acetaminophen (TYLENOL) tablet 650 mg  650 mg Oral Q6H PRN Gladstone Lighter, MD       Or  . acetaminophen (TYLENOL) suppository 650 mg  650 mg Rectal Q6H PRN Gladstone Lighter, MD      . Ampicillin-Sulbactam (UNASYN) 3 g in sodium chloride 0.9 % 100 mL IVPB  3 g Intravenous Q6H Nesbitt, Christopher A, RPH 200 mL/hr at 05/04/18 1058 3 g at 05/04/18 1058  . docusate sodium (COLACE) capsule 100 mg  100 mg Oral BID Gladstone Lighter, MD   100 mg at 05/04/18 1120  . enoxaparin (LOVENOX) injection 40 mg  40 mg Subcutaneous Q24H Gladstone Lighter, MD      . folic acid (FOLVITE) tablet 1 mg  1 mg Oral Daily Gladstone Lighter, MD   1 mg at 05/04/18 1120  . ketorolac (TORADOL) 30 MG/ML injection 30 mg  30 mg Intravenous Q6H PRN Gladstone Lighter, MD      . LORazepam (ATIVAN) injection 0-4 mg  0-4 mg Intravenous Q6H Gladstone Lighter, MD   2 mg at 05/04/18 0606   Followed by  . [START ON 05/05/2018] LORazepam (ATIVAN) injection 0-4 mg  0-4 mg Intravenous Q12H Gladstone Lighter, MD      . LORazepam (ATIVAN) tablet 1 mg  1 mg Oral Q6H PRN Gladstone Lighter, MD   1 mg at 05/03/18 2000   Or  .  LORazepam (ATIVAN) injection 1 mg  1 mg Intravenous Q6H PRN Gladstone Lighter, MD      . LORazepam (ATIVAN) tablet 1 mg  1 mg Oral Once Gladstone Lighter, MD      . multivitamin with minerals tablet 1 tablet  1 tablet Oral Daily Gladstone Lighter, MD   1 tablet at 05/04/18 1120  . nicotine (NICODERM CQ - dosed in mg/24 hours) patch 21 mg  21 mg Transdermal Daily Henreitta Leber, MD   21 mg at 05/04/18 1251  . ondansetron (ZOFRAN) tablet 4 mg  4 mg Oral Q6H PRN Gladstone Lighter, MD       Or  . ondansetron (ZOFRAN) injection 4 mg  4 mg Intravenous Q6H PRN Gladstone Lighter, MD      . polyethylene glycol (MIRALAX / GLYCOLAX) packet 17 g  17 g Oral Daily PRN Gladstone Lighter, MD      . potassium chloride SA (K-DUR,KLOR-CON) CR tablet 40 mEq  40 mEq Oral BID Henreitta Leber, MD      .  thiamine (VITAMIN B-1) tablet 100 mg  100 mg Oral Daily Gladstone Lighter, MD   100 mg at 05/04/18 1120   Or  . thiamine (B-1) injection 100 mg  100 mg Intravenous Daily Gladstone Lighter, MD      . traMADol Veatrice Bourbon) tablet 50 mg  50 mg Oral Q6H PRN Gladstone Lighter, MD      . zolpidem Lorrin Mais) tablet 5 mg  5 mg Oral QHS PRN Lance Coon, MD   5 mg at 05/03/18 2218     Abtx:  Anti-infectives (From admission, onward)   Start     Dose/Rate Route Frequency Ordered Stop   05/04/18 0300  vancomycin (VANCOCIN) IVPB 1000 mg/200 mL premix  Status:  Discontinued     1,000 mg 200 mL/hr over 60 Minutes Intravenous Every 12 hours 05/03/18 1439 05/04/18 1151   05/03/18 1600  Ampicillin-Sulbactam (UNASYN) 3 g in sodium chloride 0.9 % 100 mL IVPB     3 g 200 mL/hr over 30 Minutes Intravenous Every 6 hours 05/03/18 1332     05/03/18 1400  vancomycin (VANCOCIN) 1,250 mg in sodium chloride 0.9 % 250 mL IVPB     1,250 mg 166.7 mL/hr over 90 Minutes Intravenous  Once 05/03/18 1325 05/03/18 1607   05/03/18 1200  Ampicillin-Sulbactam (UNASYN) 3 g in sodium chloride 0.9 % 100 mL IVPB  Status:  Discontinued     3 g 200 mL/hr over 30 Minutes Intravenous  Once 05/03/18 1150 05/03/18 1332   05/03/18 1115  cefTRIAXone (ROCEPHIN) 1 g in sodium chloride 0.9 % 100 mL IVPB     1 g 200 mL/hr over 30 Minutes Intravenous  Once 05/03/18 1113 05/03/18 1237   05/03/18 1115  azithromycin (ZITHROMAX) 500 mg in sodium chloride 0.9 % 250 mL IVPB     500 mg 250 mL/hr over 60 Minutes Intravenous  Once 05/03/18 1113 05/03/18 1434      REVIEW OF SYSTEMS:  Const: Subjective fever,  chills,  weight loss Eyes: negative diplopia or visual changes, negative eye pain ENT: negative coryza, negative sore throat Resp: Positive cough, no hemoptysis, has dyspnea, grayish-green sputum Cards: negative for chest pain, palpitations, lower extremity edema GU: negative for frequency, dysuria and hematuria GI: Negative for abdominal  pain, diarrhea, bleeding, constipation Skin: negative for rash and pruritus Heme: negative for easy bruising and gum/nose bleeding MS: Generalized weakness Neurolo:negative for headaches, dizziness, vertigo, memory problems  Psych: Has anxiety and depression  and also chews his nails Endocrine: negative for thyroid, diabetes Allergy/Immunology- negative for any medication or food allergies ? Objective:  VITALS:  BP (!) 136/92   Pulse 92   Temp (!) 101.5 F (38.6 C) (Oral)   Resp 20   Ht 5' 7"  (1.702 m)   Wt 54.4 kg   SpO2 96%   BMI 18.79 kg/m  PHYSICAL EXAM:  General: Alert, cooperative, no distress, appears stated age.  Chronically ill ,pale Head: Normocephalic, without obvious abnormality, atraumatic. Eyes: Conjunctivae clear, anicteric sclerae. Pupils are equal ENT Nares normal. No drainage or sinus tenderness. Lips, mucosa, and tongue normal. No Thrush Poor dentition Neck: Supple, symmetrical, no adenopathy, thyroid: non tender no carotid bruit and no JVD. Back: No CVA tenderness. Lungs: Bilateral air entry ,crepitations Heart: Regular rate and rhythm, no murmur, rub or gallop. Abdomen: Soft, non-tender,not distended. Bowel sounds normal. No masses Extremities: atraumatic, no cyanosis. No edema.  Absent nails in many fingers Skin: No rashes or lesions. Or bruising Lymph: Cervical, supraclavicular normal. Neurologic: Grossly non-focal Pertinent Labs Lab Results CBC    Component Value Date/Time   WBC 7.8 05/04/2018 0252   RBC 3.38 (L) 05/04/2018 0252   HGB 9.9 (L) 05/04/2018 0252   HGB 14.6 01/25/2014 1544   HCT 29.4 (L) 05/04/2018 0252   HCT 45.3 01/25/2014 1544   PLT 462 (H) 05/04/2018 0252   PLT 149 (L) 01/25/2014 1544   MCV 87.0 05/04/2018 0252   MCV 88 01/25/2014 1544   MCH 29.3 05/04/2018 0252   MCHC 33.7 05/04/2018 0252   RDW 14.0 05/04/2018 0252   RDW 14.5 01/25/2014 1544    CMP Latest Ref Rng & Units 05/04/2018 05/03/2018 01/25/2014  Glucose 70 - 99  mg/dL 93 121(H) 126(H)  BUN 6 - 20 mg/dL <5(L) <5(L) 9  Creatinine 0.61 - 1.24 mg/dL 0.48(L) 0.61 0.74  Sodium 135 - 145 mmol/L 133(L) 129(L) 135(L)  Potassium 3.5 - 5.1 mmol/L 3.2(L) 3.6 4.1  Chloride 98 - 111 mmol/L 99 88(L) 102  CO2 22 - 32 mmol/L 26 28 23   Calcium 8.9 - 10.3 mg/dL 7.6(L) 8.5(L) 8.9  Total Protein 6.5 - 8.1 g/dL - 7.1 7.6  Total Bilirubin 0.3 - 1.2 mg/dL - 1.6(H) 2.1(H)  Alkaline Phos 38 - 126 U/L - 95 99  AST 15 - 41 U/L - 29 125(H)  ALT 0 - 44 U/L - 22 197(H)      Microbiology: Recent Results (from the past 240 hour(s))  Blood culture (routine x 2)     Status: None (Preliminary result)   Collection Time: 05/03/18 12:53 PM  Result Value Ref Range Status   Specimen Description BLOOD RIGHT ANTECUBITAL  Final   Special Requests   Final    BOTTLES DRAWN AEROBIC AND ANAEROBIC Blood Culture results may not be optimal due to an excessive volume of blood received in culture bottles   Culture   Final    NO GROWTH < 24 HOURS Performed at [facility], [address] [place], [place] [address]    Report Status PENDING  Incomplete  Blood culture (routine x 2)     Status: None (Preliminary result)   Collection Time: 05/03/18 12:54 PM  Result Value Ref Range Status   Specimen Description BLOOD BLOOD RIGHT WRIST  Final   Special Requests   Final    BOTTLES DRAWN AEROBIC AND ANAEROBIC Blood Culture adequate volume   Culture   Final    NO GROWTH < 24 HOURS Performed at  [facility], [address], [place], [place] [address]    Report Status PENDING  Incomplete  MRSA PCR Screening     Status: None   Collection Time: 05/03/18  7:55 PM  Result Value Ref Range Status   MRSA by PCR NEGATIVE NEGATIVE Final    Comment:        The GeneXpert MRSA Assay (FDA approved for NASAL specimens only), is one component of a comprehensive MRSA colonization surveillance program. It is not intended to diagnose MRSA infection nor to guide or monitor treatment  for MRSA infections. Performed at [facility], [address] [place], [place] [address]   Expectorated sputum assessment w rflx to resp cult     Status: None   Collection Time: 05/03/18  7:55 PM  Result Value Ref Range Status   Specimen Description SPUTUM EXPECT  Final   Special Requests Normal  Final   Sputum evaluation   Final    THIS SPECIMEN IS ACCEPTABLE FOR SPUTUM CULTURE Performed at [facility], [address] [place], [place] [address]    Report Status 05/04/2018 FINAL  Final  Culture, respiratory     Status: None (Preliminary result)   Collection Time: 05/03/18  7:55 PM  Result Value Ref Range Status   Specimen Description   Final    SPUTUM EXPECT Performed at [facility], [address], [place], [place] [address]    Special Requests   Final    Normal Reflexed from [id number] Performed at [facility], [address] [place], [place] [address]    Gram Stain   Final    MODERATE WBC PRESENT, PREDOMINANTLY PMN FEW GRAM POSITIVE COCCI IN PAIRS FEW GRAM VARIABLE ROD RARE SQUAMOUS EPITHELIAL CELLS PRESENT Performed at [facility], [place] [address] [place], [place] [address]    Culture PENDING  Incomplete   Report Status PENDING  Incomplete   IMAGING RESULTS:   ?  Widespread patchy nodular peribronchovascular consolidation with scattered cavitary change and tree-in-bud opacity throughout both lungs involving all lung lobes. The largest nodular foci of cavitary consolidation are in the upper lobes bilaterally as detailed. Multilobar infectious bronchopneumonia is suspected,  Impression/Recommendation ?43 y.o. male with a history of alcohol abuse presents with cough of few months duration, shortness of breath of 3 days duration, and night sweats.  As per patient he has had a cough for a few months now .  He will cough up sometimes grayish-green sputum.  The past 3 days he has had increasing shortness of breath well  generally and hence came to the hospital.  He drinks 6 to 12 cans of beer every day and states that he would literally fall sleep while drinking beer.  ? ?Multilobar bronchopneumonia with some cavitary lesions.  Because of the chronicity of his symptoms, excess alcohol consumption, poor dentition suspect aspiration and abscesses formation.  Likely organisms are oral flora which could be Streptococcus, anaerobes and hence Unasyn is drug of choice. AFB sputum has been collected. MAC is a remote possibility but is less likely, tuberculosis is less likely. If he does not improve on appropriate antibiotic may need bronch for diagnosis.  Anemia likely disease, delusion  Excess alcohol use  Depression Current smoker ___________________________________________________ Discussed with patient, requesting provider Note:  This document was prepared using Dragon voice recognition software and may include unintentional dictation errors.

## 2018-05-04 NOTE — Progress Notes (Signed)
ID Full consult note to follow Likely aspiration induced broncho pneumonia and cavitation Need to r/o atypical organisms like MAI/TB Continue unasyn

## 2018-05-04 NOTE — Care Management (Signed)
CM made two attempts to speak with patient but he was not available.    Scoring 3 on CIWA.  Airborne isolation, ID consult and AFB cultures pending.

## 2018-05-04 NOTE — Progress Notes (Signed)
[employer] - [place] [facility]      PATIENT NAME: Shaun Ward    MR#:  [id number]  DATE OF BIRTH:  04-20-1976  SUBJECTIVE:   Presents to the hospital with shortness of breath and cough.  Denies any hemoptysis, night sweats or weight loss.  CT chest was suggestive of multilobar pneumonia and also cavitary pneumonia.  Patient's family is at bedside.  Patient complaining of some tremors and going through alcohol withdrawal.  REVIEW OF SYSTEMS:    Review of Systems  Constitutional: Negative for chills and fever.  HENT: Negative for congestion and tinnitus.   Eyes: Negative for blurred vision and double vision.  Respiratory: Positive for cough and shortness of breath. Negative for wheezing.   Cardiovascular: Negative for chest pain, orthopnea and PND.  Gastrointestinal: Negative for abdominal pain, diarrhea, nausea and vomiting.  Genitourinary: Negative for dysuria and hematuria.  Neurological: Positive for tremors. Negative for dizziness, sensory change and focal weakness.  All other systems reviewed and are negative.   Nutrition: Regular Tolerating Diet: Yes Tolerating PT: Await Eval.   DRUG ALLERGIES:  No Known Allergies  VITALS:  Blood pressure (!) 136/92, pulse 92, temperature (!) 101.5 F (38.6 C), temperature source Oral, resp. rate 20, height 5\' 7"  (1.702 m), weight 54.4 kg, SpO2 96 %.  PHYSICAL EXAMINATION:   Physical Exam  GENERAL:  43 y.o.-year-old thin patient lying in bed in no acute distress.  EYES: Pupils equal, round, reactive to light and accommodation. No scleral icterus. Extraocular muscles intact.  HEENT: Head atraumatic, normocephalic. Oropharynx and nasopharynx clear.  NECK:  Supple, no jugular venous distention. No thyroid enlargement, no tenderness.  LUNGS: Prolonged inspiratory and expiratory phase, diffuse rhonchi bilaterally, occasional wheezing, no rales, negative use of accessory muscles. CARDIOVASCULAR: S1, S2 normal. No  murmurs, rubs, or gallops.  ABDOMEN: Soft, nontender, nondistended. Bowel sounds present. No organomegaly or mass.  EXTREMITIES: No cyanosis, clubbing or edema b/l.    NEUROLOGIC: Cranial nerves II through XII are intact. No focal Motor or sensory deficits b/l.   PSYCHIATRIC: The patient is alert and oriented x 3.  SKIN: No obvious rash, lesion, or ulcer.    LABORATORY PANEL:   CBC Recent Labs  Lab 05/04/18 0252  WBC 7.8  HGB 9.9*  HCT 29.4*  PLT 462*   ------------------------------------------------------------------------------------------------------------------  Chemistries  Recent Labs  Lab 05/03/18 0907 05/04/18 0252  NA 129* 133*  K 3.6 3.2*  CL 88* 99  CO2 28 26  GLUCOSE 121* 93  BUN <5* <5*  CREATININE 0.61 0.48*  CALCIUM 8.5* 7.6*  MG  --  1.9  AST 29  --   ALT 22  --   ALKPHOS 95  --   BILITOT 1.6*  --    ------------------------------------------------------------------------------------------------------------------  Cardiac Enzymes Recent Labs  Lab 05/03/18 0907  TROPONINI <0.03   ------------------------------------------------------------------------------------------------------------------  RADIOLOGY:  Dg Chest 2 View  Result Date: 05/03/2018 CLINICAL DATA:  Productive cough 2 weeks. EXAM: CHEST - 2 VIEW COMPARISON:  None. FINDINGS: The lungs are emphysematous. There is a focal nodule in the anterior left upper lobe which measures 2.6 cm in diameter. Peribronchial thickening is present. No consolidative process. No pneumothorax or effusion. Heart size is normal. 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 IMPRESSION: Nodular opacity in the anterior left upper lobe. CT of the chest with contrast is recommended to evaluate for carcinoma. COPD. Electronically Signed   By: Drusilla Kanner M.D.  On: 05/03/2018 09:43   Ct Chest W Contrast  Result Date: 05/03/2018 CLINICAL DATA:   Productive cough for 2 weeks. Nodular upper left lung opacity on chest radiograph. Dyspnea. Current smoker. EXAM: CT CHEST WITH CONTRAST TECHNIQUE: Multidetector CT imaging of the chest was performed during intravenous contrast administration. CONTRAST:  75mL OMNIPAQUE IOHEXOL 300 MG/ML  SOLN COMPARISON:  Chest radiograph from earlier today. FINDINGS: Cardiovascular: Normal heart size. No significant pericardial effusion/thickening. Left anterior descending coronary atherosclerosis. Great vessels are normal in course and caliber. No central pulmonary emboli. Mediastinum/Nodes: No discrete thyroid nodules. Unremarkable esophagus. No pathologically enlarged axillary, mediastinal or hilar lymph nodes. Lungs/Pleura: No pneumothorax. No pleural effusion. Mild centrilobular and paraseptal emphysema with diffuse bronchial wall thickening. There is widespread patchy nodular peribronchovascular consolidation and tree-in-bud opacity throughout both lungs involving all lung lobes, with scattered small areas of cavitation at the areas of nodular peribronchovascular consolidation in both lung. Dominant poorly marginated 4.5 x 4.0 cm focus of peribronchovascular consolidation in the anterior left upper lobe (series 3/image 57) with partial internal cavitary change. Poorly marginated cavitary 1.5 x 1.0 cm right upper lobe peribronchovascular nodular focus of consolidation (series 3/image 42). Upper abdomen: No acute abnormality. Musculoskeletal:  No aggressive appearing focal osseous lesions. IMPRESSION: 1. Widespread patchy nodular peribronchovascular consolidation with scattered cavitary change and tree-in-bud opacity throughout both lungs involving all lung lobes. The largest nodular foci of cavitary consolidation are in the upper lobes bilaterally as detailed. Multilobar infectious bronchopneumonia is suspected, with atypical etiologies including tuberculosis or MAI not excluded. Pulmonology consultation suggested. Short-term  follow-up post treatment chest CT will be required in 3 months to document resolution of these findings and to exclude underlying neoplasm in this high risk patient. 2. Mild centrilobular and paraseptal emphysema with diffuse bronchial wall thickening, suggesting COPD. 3. One vessel coronary atherosclerosis. Emphysema (ICD10-J43.9). Electronically Signed   By: Delbert PhenixJason A Poff M.D.   On: 05/03/2018 11:07     ASSESSMENT AND PLAN:   43 year old male with past medical history of alcohol abuse, tobacco abuse who presents to the hospital with cough, shortness of breath.  1.  Multi lobar pneumonia/cavitary pneumonia- suspected to be secondary to aspiration given patient history of alcohol abuse.  Cannot rule out tuberculosis presently. -Patient denies any hemoptysis or night sweats.  QuantiFERON test has been done and results pending. - Continue airborne precautions, await AFB x3 to rule out tuberculosis.  Seems unlikely although. -We will also get infectious disease consult.  MRSA PCR is negative, DC vancomycin, continue Unasyn.  Await HIV test.  2.  Alcohol abuse-patient is going through alcohol withdrawal. -Continue CIWA protocol.  Continue thiamine, folate.  3.  Tobacco abuse- placed on nicotine patch.  4.  Hypokalemia-will supplement with oral potassium, repeat level in the morning. -Check magnesium level.  5. Anemia - normocytic Anemia.  - no acute need for transfusion. ?? Related to Etoh abuse.    All the records are reviewed and case discussed with Care Management/Social Worker. Management plans discussed with the patient, family and they are in agreement.  CODE STATUS: Full code  DVT Prophylaxis: Lovenox  TOTAL TIME TAKING CARE OF THIS PATIENT: 30 minutes.   POSSIBLE D/C IN 2-3 DAYS, DEPENDING ON CLINICAL CONDITION.   Houston SirenSAINANI,Phelicia Dantes J M.D on 05/04/2018 at 3:21 PM  Between 7am to 6pm - Pager - [phone]  After 6pm go to [email or web address] - Social research officer, governmentpassword [email or web address]  [employer]  [facility]  Office  [phone]  CC: Primary care physician; Patient, No Pcp  Per

## 2018-05-05 DIAGNOSIS — E43 Unspecified severe protein-calorie malnutrition: Secondary | ICD-10-CM

## 2018-05-05 LAB — CBC
HCT: 29.7 % — ABNORMAL LOW (ref 39.0–52.0)
Hemoglobin: 9.5 g/dL — ABNORMAL LOW (ref 13.0–17.0)
MCH: 28.9 pg (ref 26.0–34.0)
MCHC: 32 g/dL (ref 30.0–36.0)
MCV: 90.3 fL (ref 80.0–100.0)
Platelets: 410 10*3/uL — ABNORMAL HIGH (ref 150–400)
RBC: 3.29 MIL/uL — AB (ref 4.22–5.81)
RDW: 14.1 % (ref 11.5–15.5)
WBC: 8.6 10*3/uL (ref 4.0–10.5)
nRBC: 0 % (ref 0.0–0.2)

## 2018-05-05 LAB — QUANTIFERON-TB GOLD PLUS (RQFGPL)
QUANTIFERON NIL VALUE: 0.01 [IU]/mL
QuantiFERON Mitogen Value: 9.54 IU/mL
QuantiFERON TB1 Ag Value: 0.01 IU/mL
QuantiFERON TB2 Ag Value: 0.02 IU/mL

## 2018-05-05 LAB — QUANTIFERON-TB GOLD PLUS: QuantiFERON-TB Gold Plus: NEGATIVE

## 2018-05-05 LAB — BASIC METABOLIC PANEL
ANION GAP: 6 (ref 5–15)
BUN: <5 mg/dL — ABNORMAL LOW (ref 6–20)
CO2: 22 mmol/L (ref 22–32)
Calcium: 7.6 mg/dL — ABNORMAL LOW (ref 8.9–10.3)
Chloride: 104 mmol/L (ref 98–111)
Creatinine, Ser: 0.5 mg/dL — ABNORMAL LOW (ref 0.61–1.24)
GFR calc Af Amer: >60 mL/min (ref >60)
GFR calc non Af Amer: >60 mL/min (ref >60)
Glucose, Bld: 95 mg/dL (ref 70–99)
Potassium: 4.1 mmol/L (ref 3.5–5.1)
Sodium: 132 mmol/L — ABNORMAL LOW (ref 135–145)

## 2018-05-05 LAB — TROPONIN I

## 2018-05-05 LAB — ACID FAST SMEAR (AFB, MYCOBACTERIA): Acid Fast Smear: NEGATIVE

## 2018-05-05 LAB — PROCALCITONIN: Procalcitonin: 1.25 ng/mL

## 2018-05-05 LAB — HIV ANTIBODY (ROUTINE TESTING W REFLEX): HIV Screen 4th Generation wRfx: NONREACTIVE

## 2018-05-05 LAB — ACID FAST SMEAR (AFB)

## 2018-05-05 MED ORDER — ENSURE ENLIVE PO LIQD
237.0000 mL | Freq: Two times a day (BID) | ORAL | Status: DC
  Administered 2018-05-05 – 2018-05-07 (×4): 237 mL via ORAL

## 2018-05-05 MED ORDER — KETOROLAC TROMETHAMINE 30 MG/ML IJ SOLN: 30.0000 mg | Freq: Three times a day (TID) | INTRAMUSCULAR | Status: AC | PRN

## 2018-05-05 NOTE — Progress Notes (Signed)
[employer] [facility] - [place] [facility]      PATIENT NAME: Shaun Ward    MR#:  [id number]  DATE OF BIRTH:  June 28, 1975  SUBJECTIVE:   Patient is having some pleuritic chest pain on the right side today.  Patient's father is at bedside.  Still having some upper airway rattling.  Afebrile, hemodynamically stable.  Still has a mild tremor.  REVIEW OF SYSTEMS:    Review of Systems  Constitutional: Negative for chills and fever.  HENT: Negative for congestion and tinnitus.   Eyes: Negative for blurred vision and double vision.  Respiratory: Positive for cough and shortness of breath. Negative for wheezing.   Cardiovascular: Negative for chest pain, orthopnea and PND.  Gastrointestinal: Negative for abdominal pain, diarrhea, nausea and vomiting.  Genitourinary: Negative for dysuria and hematuria.  Neurological: Positive for tremors. Negative for dizziness, sensory change and focal weakness.  All other systems reviewed and are negative.   Nutrition: Regular Tolerating Diet: Yes Tolerating PT: Await Eval.   DRUG ALLERGIES:  No Known Allergies  VITALS:  Blood pressure (!) 113/97, pulse (!) 106, temperature 98.9 F (37.2 C), temperature source Oral, resp. rate 16, height 5\' 7"  (1.702 m), weight 55.6 kg, SpO2 (!) 86 %.  PHYSICAL EXAMINATION:   Physical Exam  GENERAL:  43 y.o.-year-old thin patient lying in bed in no acute distress.  EYES: Pupils equal, round, reactive to light and accommodation. No scleral icterus. Extraocular muscles intact.  HEENT: Head atraumatic, normocephalic. Oropharynx and nasopharynx clear.  NECK:  Supple, no jugular venous distention. No thyroid enlargement, no tenderness.  LUNGS: Prolonged inspiratory and expiratory phase, diffuse rhonchi bilaterally, occasional wheezing, no rales, negative use of accessory muscles. CARDIOVASCULAR: S1, S2 normal. No murmurs, rubs, or gallops.  ABDOMEN: Soft, nontender, nondistended. Bowel sounds present.  No organomegaly or mass.  EXTREMITIES: No cyanosis, clubbing or edema b/l.    NEUROLOGIC: Cranial nerves II through XII are intact. No focal Motor or sensory deficits b/l.   PSYCHIATRIC: The patient is alert and oriented x 3.  SKIN: No obvious rash, lesion, or ulcer.    LABORATORY PANEL:   CBC Recent Labs  Lab 05/05/18 0251  WBC 8.6  HGB 9.5*  HCT 29.7*  PLT 410*   ------------------------------------------------------------------------------------------------------------------  Chemistries  Recent Labs  Lab 05/03/18 0907 05/04/18 0252 05/05/18 0251  NA 129* 133* 132*  K 3.6 3.2* 4.1  CL 88* 99 104  CO2 28 26 22   GLUCOSE 121* 93 95  BUN <5* <5* <5*  CREATININE 0.61 0.48* 0.50*  CALCIUM 8.5* 7.6* 7.6*  MG  --  1.9  --   AST 29  --   --   ALT 22  --   --   ALKPHOS 95  --   --   BILITOT 1.6*  --   --    ------------------------------------------------------------------------------------------------------------------  Cardiac Enzymes Recent Labs  Lab 05/03/18 0907  TROPONINI <0.03   ------------------------------------------------------------------------------------------------------------------  RADIOLOGY:  No results found.   ASSESSMENT AND PLAN:   43 year old male with past medical history of alcohol abuse, tobacco abuse who presents to the hospital with cough, shortness of breath.  1.  Multi lobar pneumonia/cavitary pneumonia- suspected to be secondary to aspiration given patient history of alcohol abuse.   -Patient denies any hemoptysis or night sweats.  QuantiFERON test has been done and results pending. - Continue airborne precautions, await AFB x3 to rule out tuberculosis.  Seems unlikely although. -Appreciate ID input and they think this is likely aspiration  2.  Continue Unasyn.  HIV test negative.  2.  Alcohol abuse-patient is going through alcohol withdrawal. -Continue CIWA protocol.  Continue thiamine, folate.  3.  Tobacco abuse- placed on  nicotine patch. - strongly advised to quit smoking.   4.  Hypokalemia - improved with supplementation.   5. Anemia - normocytic Anemia.  - no acute need for transfusion. likely Related to Etoh abuse.    All the records are reviewed and case discussed with Care Management/Social Worker. Management plans discussed with the patient, family and they are in agreement.  CODE STATUS: Full code  DVT Prophylaxis: Lovenox  TOTAL TIME TAKING CARE OF THIS PATIENT: 30 minutes.   POSSIBLE D/C IN 1-2 DAYS, DEPENDING ON CLINICAL CONDITION.   Houston SirenSAINANI, J M.D on 05/05/2018 at 2:43 PM  Between 7am to 6pm - Pager - [phone]  After 6pm go to [email or web address] - Social research officer, governmentpassword [email or web address] [email or web address]  [facility] [place] [facility]  Office  [phone]  CC: Primary care physician; Patient, No Pcp Per

## 2018-05-05 NOTE — Care Management Note (Signed)
Case Management Note  Patient Details  Name: Shaun Ward MRN: [id number] Date of Birth: 04/17/1976  Subjective/Objective:   Admitted to [facility] with the diagnosis of pneumonia. Lives with mother Claris Gladden [phone]) and daughter Percival Spanish. No primary care physician. Prescriptions are filled at [facility] in [place]. States he works full time at [employer] in [place]. No insurance. Lives in [place]. Takes care of all basic activities of daily living himself, drives.  Mr. Dortt's telephone number [phone]                 Action/Plan: [facility] and Medication Management application give. Referral to Smiley Houseman at [employer]. States he will get his father to help him fill application out.   Expected Discharge Date:                  Expected Discharge Plan:     In-House Referral:   yes  Discharge planning Services   yes  Post Acute Care Choice:    Choice offered to:     DME Arranged:    DME Agency:     HH Arranged:    HH Agency:     Status of Service:     If discussed at [employer] of Stay Meetings, dates discussed:    Additional Comments:  Gwenette Greet, RN MSN CCM Care Management [phone] 05/05/2018, 3:12 PM

## 2018-05-05 NOTE — Progress Notes (Signed)
Initial Nutrition Assessment  DOCUMENTATION CODES:   Severe malnutrition in context of social or environmental circumstances  INTERVENTION:   Monitor magnesium, potassium, and phosphorus daily for at least 3 days, MD to replete as needed, as pt is at risk for refeeding syndrome given malnutrition and EtOH abuse.  - Continue MVI with minerals, thiamine, and folic acid in the setting of EtOH abuse  - Ensure Enlive po BID, each supplement provides 350 kcal and 20 grams of protein (chocolate flavor)  - Provided education regarding high-calorie, high-protein nutrition therapy  NUTRITION DIAGNOSIS:   Severe Malnutrition related to social / environmental circumstances (EtOH abuse) as evidenced by moderate fat depletion, severe fat depletion, moderate muscle depletion, severe muscle depletion.  GOAL:   Patient will meet greater than or equal to 90% of their needs  MONITOR:   Supplement acceptance, Weight trends, PO intake, Labs  REASON FOR ASSESSMENT:   Malnutrition Screening Tool, Consult Assessment of nutrition requirement/status  ASSESSMENT:   43 year old male who presented to the ED on 1/13 with SOB, cough, and congestion. Noted pt admits to drinking 12+ beers daily. Known history of tobacco and marijuana use. Pt admitted with CAP.  Discussed pt with RN who reports pt ate at least 50% of breakfast. RN reports that pt told her that his appetite was good at the hospital but that he is concerned about maintaining his appetite after d/c.  Spoke with pt and father at bedside. Pt eating hard candy at time of visit.  Pt reports poor PO intake PTA due to alcohol intake. Pt also shares that he does not have much of an appetite and "constantly smokes." Pt states that he often goes 6 or more days without eating solid food and that when he does eat, he has one meal daily that might include a bowl of ramen noodles. Pt reports that he drinks "at least 6-12 beers daily, sometimes 18." Pt shares  that he experiences early satiety.  Pt's father shares that pt "used to eat well and loved steak and potatoes." Pt's father reports that the last time pt ate like this was "years ago." Pt endorses a steady decline in PO intake and weight over the last several years due to "an episode of depression" as well as EtOH abuse.  Pt reports that he has always been small-framed and that his UBW prior to weight loss was 125 lbs. No weight history available in chart PTA. RD obtained bed weight at time of visit: 122.5 lbs. RD recorded bed weight in pt's chart. Suspect pt's weight loss has been gradual over several years but unable to confirm with current available information.  Pt states, "when I'm not working, I'm drinking." Pt shares that he does not eat when he is working.  Pt and pt's father with questions regarding appropriate foods that pt can eat to maximize nutritional status. Discussed importance of first meeting kcal needs and focusing on adequate protein intake to maintain lean muscle mass. Discussed high-calorie, high-protein food and beverage options with pt and father.  Medications reviewed and include: Colace, folic acid, MVI with minerals, thiamine, IV Unasyn IVF: NS @ 75 ml/hr  Labs reviewed: sodium 132 (L)  NUTRITION - FOCUSED PHYSICAL EXAM:    Most Recent Value  Orbital Region  Moderate depletion  Upper Arm Region  Severe depletion  Thoracic and Lumbar Region  Severe depletion  Buccal Region  Moderate depletion  Temple Region  Moderate depletion  Clavicle Bone Region  Moderate depletion  Clavicle and Acromion  Bone Region  Severe depletion  Scapular Bone Region  Severe depletion  Dorsal Hand  Moderate depletion  Patellar Region  Severe depletion  Anterior Thigh Region  Severe depletion  Posterior Calf Region  Moderate depletion  Edema (RD Assessment)  None  Hair  Reviewed  Eyes  Reviewed  Mouth  Reviewed  Skin  Reviewed  Nails  Reviewed       Diet Order:   Diet Order             Diet regular Room service appropriate? Yes; Fluid consistency: Thin  Diet effective now              EDUCATION NEEDS:   Education needs have been addressed  Skin:  Skin Assessment: Skin Integrity Issues: Unstageable: R elbow  Last BM:  1/14  Height:   Ht Readings from Last 1 Encounters:  05/03/18 5\' 7"  (1.702 m)    Weight:   Wt Readings from Last 1 Encounters:  05/05/18 55.6 kg    Ideal Body Weight:  67.3 kg  BMI:  Body mass index is 19.19 kg/m.  Estimated Nutritional Needs:   Kcal:  1700-1900  Protein:  80-90 grams  Fluid:  1.7-1.9 L    Earma Reading, MS, RD, LDN Inpatient Clinical Dietitian Pager: [phone] Weekend/After Hours: [phone]

## 2018-05-05 NOTE — Consult Note (Signed)
Pharmacy Antibiotic Note  Shaun Ward is a 43 y.o. male admitted on 05/03/2018 with aspiration pneumonia.  Pharmacy has been consulted for Unasyn and vancomycin dosing.  Plan: Unasyn 3 g IV every 6 hours   Height: 5\' 7"  (170.2 cm) Weight: 120 lb (54.4 kg) IBW/kg (Calculated) : 66.1  Temp (24hrs), Avg:99.7 F (37.6 C), Min:98.2 F (36.8 C), Max:101.5 F (38.6 C)  Recent Labs  Lab 05/03/18 0907 05/03/18 1310 05/03/18 1543 05/04/18 0252 05/05/18 0251  WBC 12.1*  --   --  7.8 8.6  CREATININE 0.61  --   --  0.48* 0.50*  LATICACIDVEN  --  3.49* 2.03*  --   --     Estimated Creatinine Clearance: 92.6 mL/min (A) (by C-G formula based on SCr of 0.5 mg/dL (L)).    No Known Allergies  Antimicrobials this admission: 1/13 Azithromycin/CTX x 1 dose each in ED Unasyn 1/13 >>  Vanco 1/13 >>1/14  Dose adjustments this admission:   Microbiology results: 1/13 BCx: NGTD 1/13 respiratory culture pending 1/13 MRSA PCR: neg AFB x3 pending  Thank you for allowing pharmacy to be a part of this patient's care.  Marty Heck, PharmD, BCPS Clinical Pharmacist 05/05/2018 8:24 AM

## 2018-05-06 DIAGNOSIS — F17200 Nicotine dependence, unspecified, uncomplicated: Secondary | ICD-10-CM

## 2018-05-06 DIAGNOSIS — F329 Major depressive disorder, single episode, unspecified: Secondary | ICD-10-CM

## 2018-05-06 DIAGNOSIS — F1010 Alcohol abuse, uncomplicated: Secondary | ICD-10-CM

## 2018-05-06 DIAGNOSIS — J180 Bronchopneumonia, unspecified organism: Secondary | ICD-10-CM

## 2018-05-06 DIAGNOSIS — D649 Anemia, unspecified: Secondary | ICD-10-CM

## 2018-05-06 LAB — CULTURE, RESPIRATORY W GRAM STAIN: Culture: NORMAL

## 2018-05-06 LAB — MAGNESIUM: Magnesium: 1.6 mg/dL — ABNORMAL LOW (ref 1.7–2.4)

## 2018-05-06 LAB — ACID FAST SMEAR (AFB, MYCOBACTERIA): Acid Fast Smear: NEGATIVE

## 2018-05-06 LAB — CULTURE, RESPIRATORY: SPECIAL REQUESTS: NORMAL

## 2018-05-06 LAB — PHOSPHORUS: PHOSPHORUS: 4.3 mg/dL (ref 2.5–4.6)

## 2018-05-06 LAB — ACID FAST SMEAR (AFB)

## 2018-05-06 MED ORDER — IPRATROPIUM-ALBUTEROL 0.5-2.5 (3) MG/3ML IN SOLN
3.0000 mL | Freq: Four times a day (QID) | RESPIRATORY_TRACT | Status: DC
  Administered 2018-05-06 – 2018-05-08 (×8): 3 mL via RESPIRATORY_TRACT
  Filled 2018-05-06 (×8): qty 3

## 2018-05-06 MED ORDER — BUDESONIDE 0.5 MG/2ML IN SUSP
0.5000 mg | Freq: Two times a day (BID) | RESPIRATORY_TRACT | Status: DC
  Administered 2018-05-06 – 2018-05-08 (×4): 0.5 mg via RESPIRATORY_TRACT
  Filled 2018-05-06 (×4): qty 2

## 2018-05-06 MED ORDER — MAGNESIUM SULFATE 4 GM/100ML IV SOLN
4.0000 g | Freq: Once | INTRAVENOUS | Status: AC
  Administered 2018-05-06: 13:00:00 4 g via INTRAVENOUS
  Filled 2018-05-06: qty 100

## 2018-05-06 MED ORDER — GUAIFENESIN ER 600 MG PO TB12
600.0000 mg | ORAL_TABLET | Freq: Two times a day (BID) | ORAL | Status: DC
  Administered 2018-05-06 – 2018-05-08 (×5): 600 mg via ORAL
  Filled 2018-05-06 (×5): qty 1

## 2018-05-06 NOTE — Progress Notes (Signed)
   Date of Admission:  05/03/2018      ID: Shaun Ward is a 43 y.o. male    Cavitary bronchopneumonia Alcohol abuse smoker   Subjective: feelign a little better Says cough is better and sputum loosening  Medications:  . budesonide (PULMICORT) nebulizer solution  0.5 mg Nebulization BID  . docusate sodium  100 mg Oral BID  . enoxaparin (LOVENOX) injection  40 mg Subcutaneous Q24H  . feeding supplement (ENSURE ENLIVE)  237 mL Oral BID BM  . folic acid  1 mg Oral Daily  . guaiFENesin  600 mg Oral BID  . ipratropium-albuterol  3 mL Nebulization Q6H  . LORazepam  0-4 mg Intravenous Q12H  . LORazepam  0.5 mg Intravenous Once  . multivitamin with minerals  1 tablet Oral Daily  . nicotine  21 mg Transdermal Daily  . thiamine  100 mg Oral Daily   Or  . thiamine  100 mg Intravenous Daily    Objective: Vital signs in last 24 hours: Temp:  [97.4 F (36.3 C)-100.7 F (38.2 C)] 97.4 F (36.3 C) (01/16 1040) Pulse Rate:  [77-96] 82 (01/16 1450) Resp:  [16-20] 18 (01/16 1450) BP: (119-132)/(71-99) 119/71 (01/16 1238) SpO2:  [94 %-100 %] 99 % (01/16 1450)  PHYSICAL EXAM:  General: Alert, cooperative, no distress, appears stated age. pale Lungs: b/l air netry- crepts Heart: Regular rate and rhythm, no murmur, rub or gallop. Abdomen: Soft, non-tender,not distended. Bowel sounds normal. No masses Extremities: atraumatic, no cyanosis. No edema. No clubbing   Lab Results Recent Labs    05/04/18 0252 05/05/18 0251  WBC 7.8 8.6  HGB 9.9* 9.5*  HCT 29.4* 29.7*  NA 133* 132*  K 3.2* 4.1  CL 99 104  CO2 26 22  BUN <5* <5*  CREATININE 0.48* 0.50*   quantiferon gold neg 1 sputum neg for AFB  Assessment/Plan: 43 y.o. male with a history of alcohol abuse presents with cough of few months duration, shortness of breath of 3 days duration, and night sweats.  As per patient he has had a cough for a few months now .  He will cough up sometimes grayish-green sputum.  The past 3 days  he has had increasing shortness of breath well generally and hence came to the hospital.  He drinks 6 to 12 cans of beer every day and states that he would literally fall sleep while drinking beer.  ? ?Multilobar bronchopneumonia with some cavitary lesions.  Because of the chronicity of his symptoms, excess alcohol consumption, poor dentition suspect aspiration and abscesses formation.  Likely organisms are oral flora which could be Streptococcus, anaerobes and hence Unasyn is drug of choice. Awaiting sputum Afb to finalize- 1 is neg, quantiferon is neg. MAC is a remote possibility but is less likely, tuberculosis is less likely.  Anemia  Excess alcohol use  Depression  Current smoker ___________________________________________________ Discussed with patient, requesting provider

## 2018-05-06 NOTE — Progress Notes (Signed)
[employer] -  at [facility]      PATIENT NAME: Shaun ParishJeremy Bensman    MR#:  [id number]  DATE OF BIRTH:  1976-01-15  SUBJECTIVE:   Still has a cough and exertional dyspnea, tremor has improved.  Patient's father is at bedside.  AFBs x1 is negative and the other 2 sets are pending.  REVIEW OF SYSTEMS:    Review of Systems  Constitutional: Negative for chills and fever.  HENT: Negative for congestion and tinnitus.   Eyes: Negative for blurred vision and double vision.  Respiratory: Positive for cough and shortness of breath. Negative for wheezing.   Cardiovascular: Negative for chest pain, orthopnea and PND.  Gastrointestinal: Negative for abdominal pain, diarrhea, nausea and vomiting.  Genitourinary: Negative for dysuria and hematuria.  Neurological: Positive for tremors. Negative for dizziness, sensory change and focal weakness.  All other systems reviewed and are negative.   Nutrition: Regular Tolerating Diet: Yes Tolerating PT: Await Eval.   DRUG ALLERGIES:  No Known Allergies  VITALS:  Blood pressure 119/71, pulse 82, temperature (!) 97.4 F (36.3 C), temperature source Oral, resp. rate 18, height 5\' 7"  (1.702 m), weight 55.6 kg, SpO2 99 %.  PHYSICAL EXAMINATION:   Physical Exam  GENERAL:  43 y.o.-year-old thin patient lying in bed in no acute distress.  EYES: Pupils equal, round, reactive to light and accommodation. No scleral icterus. Extraocular muscles intact.  HEENT: Head atraumatic, normocephalic. Oropharynx and nasopharynx clear.  NECK:  Supple, no jugular venous distention. No thyroid enlargement, no tenderness.  LUNGS: Prolonged inspiratory and expiratory phase, diffuse rhonchi bilaterally, occasional wheezing, no rales, negative use of accessory muscles. CARDIOVASCULAR: S1, S2 normal. No murmurs, rubs, or gallops.  ABDOMEN: Soft, nontender, nondistended. Bowel sounds present. No organomegaly or mass.  EXTREMITIES: No cyanosis, clubbing or  edema b/l.    NEUROLOGIC: Cranial nerves II through XII are intact. No focal Motor or sensory deficits b/l.   PSYCHIATRIC: The patient is alert and oriented x 3.  SKIN: No obvious rash, lesion, or ulcer.    LABORATORY PANEL:   CBC Recent Labs  Lab 05/05/18 0251  WBC 8.6  HGB 9.5*  HCT 29.7*  PLT 410*   ------------------------------------------------------------------------------------------------------------------  Chemistries  Recent Labs  Lab 05/03/18 0907  05/05/18 0251 05/06/18 0517  NA 129*   < > 132*  --   K 3.6   < > 4.1  --   CL 88*   < > 104  --   CO2 28   < > 22  --   GLUCOSE 121*   < > 95  --   BUN <5*   < > <5*  --   CREATININE 0.61   < > 0.50*  --   CALCIUM 8.5*   < > 7.6*  --   MG  --    < >  --  1.6*  AST 29  --   --   --   ALT 22  --   --   --   ALKPHOS 95  --   --   --   BILITOT 1.6*  --   --   --    < > = values in this interval not displayed.   ------------------------------------------------------------------------------------------------------------------  Cardiac Enzymes Recent Labs  Lab 05/05/18 1808  TROPONINI <0.03   ------------------------------------------------------------------------------------------------------------------  RADIOLOGY:  No results found.   ASSESSMENT AND PLAN:   43 year old male with past medical history of alcohol abuse, tobacco abuse who presents to the hospital  with cough, shortness of breath.  1.  Multi lobar pneumonia/cavitary pneumonia- suspected to be secondary to aspiration given patient history of alcohol abuse.   -Patient denies any hemoptysis or night sweats.  QuantiFERON is negative.  - Continue airborne precautions, await AFB x1 is (-) and other 2 sets pending still. Placed a call to [facility] but have not heard back from them.  Unlikely this is TB.  -Appreciate ID input and they think this is likely aspiration  Continue Unasyn.  HIV test negative.  2.  Alcohol abuse-patient is going through  alcohol withdrawal. -Continue CIWA protocol.  Continue thiamine, folate. - tremors improving.   3.  Tobacco abuse- placed on nicotine patch. - strongly advised to quit smoking.   4.  Hypokalemia/Hypomagnesemia - improved with supplementation.  - will follow.  5. Anemia - normocytic Anemia.  - no acute need for transfusion. likely Related to Etoh abuse.    All the records are reviewed and case discussed with Care Management/Social Worker. Management plans discussed with the patient, family and they are in agreement.  CODE STATUS: Full code  DVT Prophylaxis: Lovenox  TOTAL TIME TAKING CARE OF THIS PATIENT: 30 minutes.   POSSIBLE D/C IN 1-2 DAYS, DEPENDING ON CLINICAL CONDITION.   Houston Siren M.D on 05/06/2018 at 3:29 PM  Between 7am to 6pm - Pager - [phone]  After 6pm go to [email or web address] - Social research officer, government  [facility] [place] [facility]  Office  [phone]  CC: Primary care physician; Patient, No Pcp Per

## 2018-05-07 LAB — POTASSIUM: Potassium: 4.5 mmol/L (ref 3.5–5.1)

## 2018-05-07 LAB — MAGNESIUM: Magnesium: 2 mg/dL (ref 1.7–2.4)

## 2018-05-07 LAB — PHOSPHORUS: Phosphorus: 4.9 mg/dL — ABNORMAL HIGH (ref 2.5–4.6)

## 2018-05-07 MED ORDER — LORAZEPAM 1 MG PO TABS: 1.0000 mg | ORAL_TABLET | Freq: Four times a day (QID) | ORAL | Status: DC | PRN

## 2018-05-07 MED ORDER — ALBUTEROL SULFATE HFA 108 (90 BASE) MCG/ACT IN AERS: 2.0000 | INHALATION_SPRAY | Freq: Four times a day (QID) | RESPIRATORY_TRACT | Status: DC | PRN

## 2018-05-07 MED ORDER — SODIUM CHLORIDE 0.9 % IV SOLN: INTRAVENOUS | Status: DC | PRN

## 2018-05-07 MED ORDER — ALBUTEROL SULFATE (2.5 MG/3ML) 0.083% IN NEBU: 2.5000 mg | INHALATION_SOLUTION | Freq: Four times a day (QID) | RESPIRATORY_TRACT | Status: DC | PRN

## 2018-05-07 MED ORDER — LORAZEPAM 2 MG/ML IJ SOLN: 0.0000 mg | Freq: Two times a day (BID) | INTRAMUSCULAR | Status: DC

## 2018-05-07 MED ORDER — LORAZEPAM 2 MG/ML IJ SOLN
1.0000 mg | Freq: Four times a day (QID) | INTRAMUSCULAR | Status: DC | PRN
  Administered 2018-05-07: 2 mg via INTRAVENOUS
  Filled 2018-05-07: qty 1

## 2018-05-07 MED ORDER — LORAZEPAM 2 MG/ML IJ SOLN: 1.0000 mg | Freq: Four times a day (QID) | INTRAMUSCULAR | Status: DC | PRN

## 2018-05-07 MED ORDER — INFLUENZA VAC SPLIT QUAD 0.5 ML IM SUSY
0.5000 mL | PREFILLED_SYRINGE | INTRAMUSCULAR | Status: AC
  Administered 2018-05-08: 09:00:00 0.5 mL via INTRAMUSCULAR
  Filled 2018-05-07: qty 0.5

## 2018-05-07 NOTE — Progress Notes (Signed)
Patient's ativan is completed, patient is on CIWA precautions, notified Dr. Imogene Burn on call. Received verbal order to restart Ativan order for CIWA.

## 2018-05-07 NOTE — Progress Notes (Signed)
[facility] [place] at [facility]      PATIENT NAME: Shaun Ward    MR#:  [id number]  DATE OF BIRTH:  11-04-1975  SUBJECTIVE:   Shortness of breath, cough improved, still has some resting tremor.  Patient's father is at bedside.  AFBs x2 have been negative.  REVIEW OF SYSTEMS:    Review of Systems  Constitutional: Negative for chills and fever.  HENT: Negative for congestion and tinnitus.   Eyes: Negative for blurred vision and double vision.  Respiratory: Positive for cough and shortness of breath. Negative for wheezing.   Cardiovascular: Negative for chest pain, orthopnea and PND.  Gastrointestinal: Negative for abdominal pain, diarrhea, nausea and vomiting.  Genitourinary: Negative for dysuria and hematuria.  Neurological: Positive for tremors. Negative for dizziness, sensory change and focal weakness.  All other systems reviewed and are negative.   Nutrition: Regular Tolerating Diet: Yes Tolerating PT: Await Eval.   DRUG ALLERGIES:  No Known Allergies  VITALS:  Blood pressure 124/86, pulse (!) 109, temperature 98.9 F (37.2 C), resp. rate 20, height 5\' 7"  (1.702 m), weight 55.6 kg, SpO2 97 %.  PHYSICAL EXAMINATION:   Physical Exam  GENERAL:  43 y.o.-year-old thin patient lying in bed in no acute distress.  EYES: Pupils equal, round, reactive to light and accommodation. No scleral icterus. Extraocular muscles intact.  HEENT: Head atraumatic, normocephalic. Oropharynx and nasopharynx clear.  NECK:  Supple, no jugular venous distention. No thyroid enlargement, no tenderness.  LUNGS: Prolonged inspiratory and expiratory phase, diffuse rhonchi bilaterally, occasional wheezing, no rales, negative use of accessory muscles. CARDIOVASCULAR: S1, S2 normal. No murmurs, rubs, or gallops.  ABDOMEN: Soft, nontender, nondistended. Bowel sounds present. No organomegaly or mass.  EXTREMITIES: No cyanosis, clubbing or edema b/l.    NEUROLOGIC: Cranial nerves II  through XII are intact. No focal Motor or sensory deficits b/l.   PSYCHIATRIC: The patient is alert and oriented x 3.  SKIN: No obvious rash, lesion, or ulcer.    LABORATORY PANEL:   CBC Recent Labs  Lab 05/05/18 0251  WBC 8.6  HGB 9.5*  HCT 29.7*  PLT 410*   ------------------------------------------------------------------------------------------------------------------  Chemistries  Recent Labs  Lab 05/03/18 0907  05/05/18 0251  05/07/18 0324  NA 129*   < > 132*  --   --   K 3.6   < > 4.1  --  4.5  CL 88*   < > 104  --   --   CO2 28   < > 22  --   --   GLUCOSE 121*   < > 95  --   --   BUN <5*   < > <5*  --   --   CREATININE 0.61   < > 0.50*  --   --   CALCIUM 8.5*   < > 7.6*  --   --   MG  --    < >  --    < > 2.0  AST 29  --   --   --   --   ALT 22  --   --   --   --   ALKPHOS 95  --   --   --   --   BILITOT 1.6*  --   --   --   --    < > = values in this interval not displayed.   ------------------------------------------------------------------------------------------------------------------  Cardiac Enzymes Recent Labs  Lab 05/05/18 1808  TROPONINI <0.03   ------------------------------------------------------------------------------------------------------------------  RADIOLOGY:  No results found.   ASSESSMENT AND PLAN:   43 year old male with past medical history of alcohol abuse, tobacco abuse who presents to the hospital with cough, shortness of breath.  1.  Multi lobar pneumonia/cavitary pneumonia- suspected to be secondary to aspiration given patient history of alcohol abuse.   -Patient denies any hemoptysis or night sweats.  QuantiFERON is negative.  - Continue airborne precautions, await AFB x2 is (-) and other 1 set pending still. Unlikely this is TB.  -Appreciate ID input and they think this is likely aspiration  Continue Unasyn.  HIV test negative. -Discussed with infectious disease and once TB has been ruled out patient can be  discharged on few weeks of oral Augmentin and follow-up with outpatient pulmonary to have a repeat CT chest in 3 to 4 weeks.  2.  Alcohol abuse-patient is going through alcohol withdrawal. -Continue CIWA protocol.  Continue thiamine, folate. - tremors improving.  he will probably need to be discharged patient on a Librium taper.  3.  Tobacco abuse- placed on nicotine patch. - strongly advised to quit smoking.   4.  Hypokalemia/Hypomagnesemia - improved with supplementation.  - cont. To monitor.   5. Anemia - normocytic Anemia.  - no acute need for transfusion. likely Related to Etoh abuse.   We discharged to home once the last AFB smear is back.   All the records are reviewed and case discussed with Care Management/Social Worker. Management plans discussed with the patient, family and they are in agreement.  CODE STATUS: Full code  DVT Prophylaxis: Lovenox  TOTAL TIME TAKING CARE OF THIS PATIENT: 30 minutes.   POSSIBLE D/C IN 1-2 DAYS, DEPENDING ON CLINICAL CONDITION.   Houston Siren M.D on 05/07/2018 at 4:11 PM  Between 7am to 6pm - Pager - [phone]  After 6pm go to [email or web address] - Social research officer, government  [facility] [place] [facility]  Office  [phone]  CC: Primary care physician; Patient, No Pcp Per

## 2018-05-08 LAB — PHOSPHORUS: Phosphorus: 4.3 mg/dL (ref 2.5–4.6)

## 2018-05-08 LAB — CULTURE, BLOOD (ROUTINE X 2)
Culture: NO GROWTH
Culture: NO GROWTH
Special Requests: ADEQUATE

## 2018-05-08 LAB — MAGNESIUM: Magnesium: 1.9 mg/dL (ref 1.7–2.4)

## 2018-05-08 MED ORDER — AMOXICILLIN-POT CLAVULANATE 875-125 MG PO TABS: 1.0000 | ORAL_TABLET | Freq: Two times a day (BID) | ORAL | 0 refills | Status: AC

## 2018-05-08 MED ORDER — CHLORDIAZEPOXIDE HCL 10 MG PO CAPS: ORAL_CAPSULE | ORAL | 0 refills | Status: DC

## 2018-05-08 MED ORDER — IPRATROPIUM-ALBUTEROL 0.5-2.5 (3) MG/3ML IN SOLN: 3.0000 mL | Freq: Two times a day (BID) | RESPIRATORY_TRACT | Status: DC

## 2018-05-08 NOTE — Plan of Care (Signed)

## 2018-05-08 NOTE — Progress Notes (Signed)
[facility]    Shaun Ward was admitted to the Hospital on 05/03/2018 and Discharged  05/08/2018 and should be excused from work/school   for 10 days starting 05/03/2018 , may return to work/school without any restrictions.  Call Hilda Lias MD, [facility]  [phone] with questions.  Houston Siren M.D on 05/08/2018,at 1:54 PM

## 2018-05-08 NOTE — Discharge Summary (Signed)
[facility] [place] at [facility]   PATIENT NAME: Shaun Ward    MR#:  [id number]  DATE OF BIRTH:  January 08, 1976  DATE OF ADMISSION:  05/03/2018 ADMITTING PHYSICIAN: Enid Baas, MD  DATE OF DISCHARGE: 05/08/2018  PRIMARY CARE PHYSICIAN: Patient, No Pcp Per    ADMISSION DIAGNOSIS:  Community acquired pneumonia, unspecified laterality [J18.9]  DISCHARGE DIAGNOSIS:  Active Problems:   Pneumonia   Pressure injury of skin   Protein-calorie malnutrition, severe   SECONDARY DIAGNOSIS:   Past Medical History:  Diagnosis Date  . Alcohol abuse   . Polysubstance abuse (HCC)   . Tobacco abuse     HOSPITAL COURSE:   43 year old male with past medical history of alcohol abuse, tobacco abuse who presents to the hospital with cough, shortness of breath.  1.  Multi lobar pneumonia/cavitary pneumonia-  patient presented to the hospital due to shortness of breath and cough and underwent CT chest which was suggestive of this. -Patient was placed on airborne precautions and had AFBs x2 which have been negative and also QuantiFERON test which is also negative.  The third AFB still pending but clinically patient has no hemoptysis or any evidence of tuberculosis presently. -Infectious disease consult was obtained and also spoke to pulmonary over the phone.  As per their evaluation this is likely aspiration pneumonia and therefore patient was treated with IV Unasyn.  Discussed with pulmonary and patient is to be treated for this for 2 to 3 weeks with repeat CT chest with them as an outpatient.  Patient is now being discharged on oral Augmentin for additional 2 weeks and follow-up with them for repeat imaging in the next 2 weeks.   2.  Alcohol abuse- patient did go through alcohol withdrawal and was placed on CIWA protocol while in the hospital. -He has improved, he is now being discharged on a few days of a Librium taper.    3.  Tobacco abuse- placed on nicotine patch  while in the hospital, strongly advised to quit smoking..  4.  Hypokalemia/Hypomagnesemia -this has improved and resolved with supplementation.  5. Anemia - normocytic Anemia. -Can be further followed as an outpatient.  Secondary to alcohol abuse.  DISCHARGE CONDITIONS:   Stable.   CONSULTS OBTAINED:  Treatment Team:  Lynn Ito, MD  DRUG ALLERGIES:  No Known Allergies  DISCHARGE MEDICATIONS:   Allergies as of 05/08/2018   No Known Allergies     Medication List    TAKE these medications   amoxicillin-clavulanate 875-125 MG tablet Commonly known as:  AUGMENTIN Take 1 tablet by mouth 2 (two) times daily for 14 days.   chlordiazePOXIDE 10 MG capsule Commonly known as:  LIBRIUM Take 1 tab PO TID X 2 days, 1 tab PO BID  X 2 days, then 1 tab PO Daily X 2 days and then STOP         DISCHARGE INSTRUCTIONS:   DIET:  Regular diet  DISCHARGE CONDITION:  Stable  ACTIVITY:  Activity as tolerated  OXYGEN:  Home Oxygen: No.   Oxygen Delivery: room air  DISCHARGE LOCATION:  home   If you experience worsening of your admission symptoms, develop shortness of breath, life threatening emergency, suicidal or homicidal thoughts you must seek medical attention immediately by calling 911 or calling your MD immediately  if symptoms less severe.  You Must read complete instructions/literature along with all the possible adverse reactions/side effects for all the Medicines you take and that have been prescribed to you. Take  any new Medicines after you have completely understood and accpet all the possible adverse reactions/side effects.   Please note  You were cared for by a hospitalist during your hospital stay. If you have any questions about your discharge medications or the care you received while you were in the hospital after you are discharged, you can call the unit and asked to speak with the hospitalist on call if the hospitalist that took care of you is not  available. Once you are discharged, your primary care physician will handle any further medical issues. Please note that NO REFILLS for any discharge medications will be authorized once you are discharged, as it is imperative that you return to your primary care physician (or establish a relationship with a primary care physician if you do not have one) for your aftercare needs so that they can reassess your need for medications and monitor your lab values.     Today   Acute events overnight, shortness of breath improved.  Positive cough but nonproductive.  Tremors have improved.  Patient's father is at bedside.  Patient ambulating well in the room.  Will discharge home today.  VITAL SIGNS:  Blood pressure 106/73, pulse 80, temperature 98.5 F (36.9 C), temperature source Oral, resp. rate 20, height 5\' 7"  (1.702 m), weight 55.6 kg, SpO2 98 %.  I/O:  No intake or output data in the 24 hours ending 05/08/18 1305  PHYSICAL EXAMINATION:   GENERAL:  43 y.o.-year-old thin patient lying in bed in no acute distress.  EYES: Pupils equal, round, reactive to light and accommodation. No scleral icterus. Extraocular muscles intact.  HEENT: Head atraumatic, normocephalic. Oropharynx and nasopharynx clear.  NECK:  Supple, no jugular venous distention. No thyroid enlargement, no tenderness.  LUNGS: Prolonged inspiratory and expiratory phase, diffuse rhonchi bilaterally, occasional wheezing, no rales, negative use of accessory muscles. CARDIOVASCULAR: S1, S2 normal. No murmurs, rubs, or gallops.  ABDOMEN: Soft, nontender, nondistended. Bowel sounds present. No organomegaly or mass.  EXTREMITIES: No cyanosis, clubbing or edema b/l.    NEUROLOGIC: Cranial nerves II through XII are intact. No focal Motor or sensory deficits b/l.   PSYCHIATRIC: The patient is alert and oriented x 3.  SKIN: No obvious rash, lesion, or ulcer.   DATA REVIEW:   CBC Recent Labs  Lab 05/05/18 0251  WBC 8.6  HGB 9.5*  HCT  29.7*  PLT 410*    Chemistries  Recent Labs  Lab 05/03/18 0907  05/05/18 0251  05/07/18 0324 05/08/18 0405  NA 129*   < > 132*  --   --   --   K 3.6   < > 4.1  --  4.5  --   CL 88*   < > 104  --   --   --   CO2 28   < > 22  --   --   --   GLUCOSE 121*   < > 95  --   --   --   BUN <5*   < > <5*  --   --   --   CREATININE 0.61   < > 0.50*  --   --   --   CALCIUM 8.5*   < > 7.6*  --   --   --   MG  --    < >  --    < > 2.0 1.9  AST 29  --   --   --   --   --  ALT 22  --   --   --   --   --   ALKPHOS 95  --   --   --   --   --   BILITOT 1.6*  --   --   --   --   --    < > = values in this interval not displayed.    Cardiac Enzymes Recent Labs  Lab 05/05/18 1808  TROPONINI <0.03    Microbiology Results  Results for orders placed or performed during the hospital encounter of 05/03/18  Blood culture (routine x 2)     Status: None   Collection Time: 05/03/18 12:53 PM  Result Value Ref Range Status   Specimen Description BLOOD RIGHT ANTECUBITAL  Final   Special Requests   Final    BOTTLES DRAWN AEROBIC AND ANAEROBIC Blood Culture results may not be optimal due to an excessive volume of blood received in culture bottles   Culture   Final    NO GROWTH 5 DAYS Performed at [facility], [address], [place], [place] [address]    Report Status 05/08/2018 FINAL  Final  Blood culture (routine x 2)     Status: None   Collection Time: 05/03/18 12:54 PM  Result Value Ref Range Status   Specimen Description BLOOD BLOOD RIGHT WRIST  Final   Special Requests   Final    BOTTLES DRAWN AEROBIC AND ANAEROBIC Blood Culture adequate volume   Culture   Final    NO GROWTH 5 DAYS Performed at [facility], [address] [place], [place] [address]    Report Status 05/08/2018 FINAL  Final  MRSA PCR Screening     Status: None   Collection Time: 05/03/18  7:55 PM  Result Value Ref Range Status   MRSA by PCR NEGATIVE NEGATIVE Final    Comment:        The GeneXpert  MRSA Assay (FDA approved for NASAL specimens only), is one component of a comprehensive MRSA colonization surveillance program. It is not intended to diagnose MRSA infection nor to guide or monitor treatment for MRSA infections. Performed at [facility], [address] [place], [place] [address]   Expectorated sputum assessment w rflx to resp cult     Status: None   Collection Time: 05/03/18  7:55 PM  Result Value Ref Range Status   Specimen Description SPUTUM EXPECT  Final   Special Requests Normal  Final   Sputum evaluation   Final    THIS SPECIMEN IS ACCEPTABLE FOR SPUTUM CULTURE Performed at [facility], [address] [place], [place] [address]    Report Status 05/04/2018 FINAL  Final  Acid Fast Smear (AFB)     Status: None   Collection Time: 05/03/18  7:55 PM  Result Value Ref Range Status   AFB Specimen Processing Concentration  Final   Acid Fast Smear Negative  Final    Comment: (NOTE) Performed At: [facility] [address] [place], [place] [id number] Jolene Schimke MD MV:[id number]    Source (AFB) SPUTUM  Final    Comment: Performed at [facility], [address] [place], [place] [address]  Culture, respiratory     Status: None   Collection Time: 05/03/18  7:55 PM  Result Value Ref Range Status   Specimen Description   Final    SPUTUM EXPECT Performed at [facility], [address], [place], [place] [address]    Special Requests  Final    Normal Reflexed from [id number] Performed at [facility], [address] [place], [place] [address]    Gram Stain   Final    MODERATE WBC PRESENT, PREDOMINANTLY PMN FEW GRAM POSITIVE COCCI IN PAIRS FEW GRAM VARIABLE ROD RARE SQUAMOUS EPITHELIAL CELLS PRESENT    Culture   Final    FEW Consistent with normal respiratory flora. Performed at [facility], [address], [place], [place] [address]    Report Status 05/06/2018 FINAL  Final  Acid Fast  Smear (AFB)     Status: None   Collection Time: 05/04/18  5:00 AM  Result Value Ref Range Status   AFB Specimen Processing Comment  Final    Comment: Tissue Grinding and Digestion/Decontamination   Acid Fast Smear Negative  Final    Comment: (NOTE) Performed At: [facility] [address] [place], [place] [id number] Jolene Schimke MD VO:[id number]    Source (AFB) SPUTUM  Final    Comment: Performed at [facility], [address], [place], [place] [address]    RADIOLOGY:  No results found.    Management plans discussed with the patient, family and they are in agreement.  CODE STATUS:     Code Status Orders  (From admission, onward)         Start     Ordered   05/03/18 1816  Full code  Continuous     05/03/18 1815        TOTAL TIME TAKING CARE OF THIS PATIENT: 40 minutes.    Houston Siren M.D on 05/08/2018 at 1:05 PM  Between 7am to 6pm - Pager - [phone]  After 6pm go to [email or web address] - Social research officer, government  [facility] [place] [facility]  Office  [phone]  CC: Primary care physician; Patient, No Pcp Per

## 2018-05-08 NOTE — Progress Notes (Signed)
Pt D/C to home with father. IV removed intact. Education completed. VSS. Belongings sent with pt. All questions answered.

## 2018-05-11 LAB — ACID FAST SMEAR (AFB, MYCOBACTERIA): Acid Fast Smear: NEGATIVE

## 2018-05-12 ENCOUNTER — Telehealth: Payer: Self-pay

## 2018-05-12 NOTE — Telephone Encounter (Signed)
EMMI Follow-up: Noted on the report that the patient does have a follow-up appointment. I left a message for Shaun Ward to call me at his convenience as it is noted on the After Visit Summary to follow-up with [facility] within 2 weeks.

## 2018-05-18 ENCOUNTER — Ambulatory Visit (INDEPENDENT_AMBULATORY_CARE_PROVIDER_SITE_OTHER): Payer: Self-pay | Admitting: Pulmonary Disease

## 2018-05-18 ENCOUNTER — Other Ambulatory Visit
Admission: RE | Admit: 2018-05-18 | Discharge: 2018-05-18 | Disposition: A | Discharge status: Home / Self Care | Payer: Self-pay | Source: Ambulatory Visit | Attending: Pulmonary Disease | Admitting: Pulmonary Disease

## 2018-05-18 ENCOUNTER — Encounter: Payer: Self-pay | Admitting: Pulmonary Disease

## 2018-05-18 VITALS — BP 124/82 | HR 133 | Ht 67.0 in | Wt 111.4 lb

## 2018-05-18 DIAGNOSIS — J189 Pneumonia, unspecified organism: Secondary | ICD-10-CM

## 2018-05-18 DIAGNOSIS — J984 Other disorders of lung: Secondary | ICD-10-CM | POA: Insufficient documentation

## 2018-05-18 DIAGNOSIS — J432 Centrilobular emphysema: Secondary | ICD-10-CM

## 2018-05-18 DIAGNOSIS — F17200 Nicotine dependence, unspecified, uncomplicated: Secondary | ICD-10-CM

## 2018-05-18 NOTE — Patient Instructions (Addendum)
Blood test today: Alpha-1 antitrypsin level, QuantiFERON gold, Fungitell Complete antibiotics as prescribed at time of discharge Smoking cessation as discussed Follow-up 05/28/18 with PFTs (lung function test) and chest x-ray prior to that visit

## 2018-05-18 NOTE — Progress Notes (Signed)
PULMONARY CONSULT NOTE  Requesting MD/Service: Self Date of initial consultation: 05/18/18 Reason for consultation: Recent hospitalization for pneumonia  PT PROFILE: 43 y.o. male smoker, alcoholic hospitalized 1/13-1/18/2020 with discharge diagnosis of cavitary pneumonia, protein-calorie malnutrition, alcohol abuse  DATA: 05/03/18 CT chest: Widespread patchy nodular peribronchovascular consolidation with scattered cavitary change and tree-in-bud opacity throughout both lungs involving all lung lobes. The largest nodular foci of cavitary consolidation are in the upper lobes bilaterally as detailed. Multilobar infectious bronchopneumonia is suspected, with atypical etiologies including tuberculosis or MAI not excluded 05/03/18 Quant gold: Negative 01/13-01/15/20: AFB smear negative x 3.   INTERVAL:  HPI:  He was hospitalized at [facility] recently when he presented to the emergency department with increasing dyspnea over 3-4 weeks duration.  At the time of his admission he denies having fever or night sweats.  He had cough which was subsequently described as purulent sputum during the hospitalization.  His chest x-ray revealed patchy opacities and CT scan of the chest was performed with findings as documented above.  Evaluation for tuberculosis was negative (to date) and he was treated as aspiration pneumonia with ampicillin-sulbactam, discharged on Augmentin.  Overall he is improved.  He denies shortness of breath currently.  However, he continues to report night sweats.  Since the onset of this illness he has had a 5-10 pound weight loss.  Since discharge, he has avoided alcohol.  He is working on smoking cessation and currently smokes approximately 3 cigarettes/day per his report.  He is employed in the [employer] of [employer].  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  Other than this, there are no significant environmental  exposures noted.  In the course of his work he does have some exposure to immigrant workers.  He has no definitely known TB exposures.  He was never in the [employer].  There is no significant travel history.  Past Medical History:  Diagnosis Date  . Alcohol abuse   . Polysubstance abuse (HCC)   . Tobacco abuse     Past Surgical History:  Procedure Laterality Date  . HERNIA REPAIR      MEDICATIONS: I have reviewed all medications and confirmed regimen as documented  Social History   Socioeconomic History  . Marital status: Divorced    Spouse name: Not on file  . Number of children: Not on file  . Years of education: Not on file  . Highest education level: Not on file  Occupational History  . Not on file  Social Needs  . Financial resource strain: Not on file  . Food insecurity:    Worry: Not on file    Inability: Not on file  . Transportation needs:    Medical: Not on file    Non-medical: Not on file  Tobacco Use  . Smoking status: Current Every Day Smoker    Packs/day: 1.50    Types: Cigarettes  . Smokeless tobacco: Never Used  . Tobacco comment: currently down to 1-2 cigs a day, occassional chewing tobacco  Substance and Sexual Activity  . Alcohol use: Yes    Alcohol/week: 6.0 - 12.0 standard drinks    Types: 6 - 12 Cans of beer per week    Comment: daily  . Drug use: Not Currently  . Sexual activity: Not on file  Lifestyle  . Physical activity:    Days per week: Not on file    Minutes per session: Not on file  .  Stress: Not on file  Relationships  . Social connections:    Talks on phone: Not on file    Gets together: Not on file    Attends religious service: Not on file    Active member of club or organization: Not on file    Attends meetings of clubs or organizations: Not on file    Relationship status: Not on file  . Intimate partner violence:    Fear of current or ex partner: Not on file    Emotionally abused: Not on file    Physically abused:  Not on file    Forced sexual activity: Not on file  Other Topics Concern  . Not on file  Social History Narrative   Lives at home with his mother and daughter.  Independent at baseline    Family History  Problem Relation Age of Onset  . Diabetes Father   . CAD Paternal Grandmother     ROS: No documented fever but + night sweats, myalgias/arthralgias No new focal weakness or sensory deficits No otalgia, hearing loss, visual changes, nasal and sinus symptoms, mouth and throat problems No neck pain or adenopathy No abdominal pain, N/V/D, diarrhea, change in bowel pattern No dysuria, change in urinary pattern   Vitals:   05/18/18 0950 05/18/18 1001  BP:  124/82  Pulse:  (!) 133  SpO2:  97%  Weight: 111 lb 6.4 oz (50.5 kg)   Height: 5\' 7"  (1.702 m)      EXAM:  Gen: Thin, bordering cachectic, No overt respiratory distress HEENT: NCAT, sclera white, oropharynx normal Neck: Supple without LAN, thyromegaly, JVD Lungs: Mildly hyperresonant to percussion, breath sounds full without wheezes or other adventitious sounds.  Cardiovascular: RRR, no murmurs noted Abdomen: Soft, nontender, normal BS Ext: without clubbing, cyanosis, edema Neuro: CNs grossly intact, motor and sensory intact Skin: Limited exam, no lesions noted  DATA:   BMP Latest Ref Rng & Units 05/07/2018 05/05/2018 05/04/2018  Glucose 70 - 99 mg/dL - 95 93  BUN 6 - 20 mg/dL - <5(T) <7(D)  Creatinine 0.61 - 1.24 mg/dL - 2.20(U) 5.42(H)  Sodium 135 - 145 mmol/L - 132(L) 133(L)  Potassium 3.5 - 5.1 mmol/L 4.5 4.1 3.2(L)  Chloride 98 - 111 mmol/L - 104 99  CO2 22 - 32 mmol/L - 22 26  Calcium 8.9 - 10.3 mg/dL - 7.6(L) 7.6(L)    CBC Latest Ref Rng & Units 05/05/2018 05/04/2018 05/03/2018  WBC 4.0 - 10.5 K/uL 8.6 7.8 12.1(H)  Hemoglobin 13.0 - 17.0 g/dL 0.6(C) 3.7(S) 12.5(L)  Hematocrit 39.0 - 52.0 % 29.7(L) 29.4(L) 37.1(L)  Platelets 150 - 400 K/uL 410(H) 462(H) 615(H)    CXR 05/03/18:   Hyperinflated with small  cardiac shadow.  Ill-defined patchy nodular opacities, most prominent in LUL  I have personally reviewed all chest radiographs reported above including CXRs and CT chest unless otherwise indicated  IMPRESSION:     ICD-10-CM   1. Smoker F17.200   2. Centrilobular emphysema (HCC) J43.2 Alpha-1-antitrypsin    Pulmonary Function Test [facility] Only  3. Multifocal cavitary pneumonia J18.9 Fungitell, Serum   J98.4 QuantiFERON-TB Gold Plus    DG Chest 2 View   The multifocal cavitary pneumonia is most likely a bacterial process and seems to be responding to antibacterials.  However, I am concerned about the CT scan appearance, which would be consistent with tuberculosis or fungal infection, and the persistence of night sweats.  By chest x-ray, emphysema appears to be significantly advanced despite his relatively  young age  PLAN:  Blood test today: Alpha-1 antitrypsin level, QuantiFERON gold (repeat), Fungitell  He is instructed to complete Augmentin as prescribed at the time of discharge We discussed smoking cessation Follow-up 05/28/2018 with PFTs and repeat CXR at that time At some point, he will need a repeat CT of chest If CT abnormalities persist or progress, we will need to consider bronchoscopy   Billy Fischeravid Simonds, MD PCCM service Mobile [phone] Pager [phone] 05/18/2018 10:53 AM

## 2018-05-19 LAB — ALPHA-1-ANTITRYPSIN: A1 ANTITRYPSIN SER: 224 mg/dL — AB (ref 101–187)

## 2018-05-20 LAB — QUANTIFERON-TB GOLD PLUS: QuantiFERON-TB Gold Plus: NEGATIVE

## 2018-05-20 LAB — QUANTIFERON-TB GOLD PLUS (RQFGPL)
QuantiFERON Mitogen Value: >10 IU/mL
QuantiFERON Nil Value: 0.01 IU/mL
QuantiFERON TB1 Ag Value: 0.01 IU/mL
QuantiFERON TB2 Ag Value: 0.01 IU/mL

## 2018-05-20 LAB — FUNGITELL, SERUM: Fungitell Result: <31 pg/mL (ref <80)

## 2018-05-25 ENCOUNTER — Ambulatory Visit: Payer: MEDICAID

## 2018-05-28 ENCOUNTER — Ambulatory Visit: Payer: Self-pay | Admitting: Pulmonary Disease

## 2018-06-15 ENCOUNTER — Ambulatory Visit
Admission: RE | Admit: 2018-06-15 | Discharge: 2018-06-15 | Disposition: A | Discharge status: Home / Self Care | Payer: Self-pay | Source: Ambulatory Visit | Attending: Pulmonary Disease | Admitting: Pulmonary Disease

## 2018-06-15 ENCOUNTER — Ambulatory Visit (HOSPITAL_COMMUNITY): Payer: Self-pay

## 2018-06-15 DIAGNOSIS — J984 Other disorders of lung: Principal | ICD-10-CM

## 2018-06-15 DIAGNOSIS — J189 Pneumonia, unspecified organism: Secondary | ICD-10-CM

## 2018-06-15 DIAGNOSIS — J432 Centrilobular emphysema: Secondary | ICD-10-CM

## 2018-06-17 LAB — ACID FAST CULTURE WITH REFLEXED SENSITIVITIES (MYCOBACTERIA): Acid Fast Culture: NEGATIVE

## 2018-06-18 LAB — ACID FAST CULTURE WITH REFLEXED SENSITIVITIES (MYCOBACTERIA)
Acid Fast Culture: NEGATIVE
Acid Fast Culture: NEGATIVE

## 2018-06-21 ENCOUNTER — Ambulatory Visit (INDEPENDENT_AMBULATORY_CARE_PROVIDER_SITE_OTHER): Payer: Self-pay | Admitting: Pulmonary Disease

## 2018-06-21 ENCOUNTER — Encounter: Payer: Self-pay | Admitting: Pulmonary Disease

## 2018-06-21 VITALS — BP 118/78 | HR 90 | Ht 67.0 in | Wt 118.0 lb

## 2018-06-21 DIAGNOSIS — R918 Other nonspecific abnormal finding of lung field: Secondary | ICD-10-CM

## 2018-06-21 DIAGNOSIS — J449 Chronic obstructive pulmonary disease, unspecified: Secondary | ICD-10-CM

## 2018-06-21 DIAGNOSIS — F17200 Nicotine dependence, unspecified, uncomplicated: Secondary | ICD-10-CM

## 2018-06-21 DIAGNOSIS — J432 Centrilobular emphysema: Secondary | ICD-10-CM

## 2018-06-21 MED ORDER — UMECLIDINIUM-VILANTEROL 62.5-25 MCG/INH IN AEPB: 1.0000 | INHALATION_SPRAY | Freq: Every day | RESPIRATORY_TRACT | 0 refills | Status: AC

## 2018-06-21 NOTE — Progress Notes (Signed)
Patient seen in the office today and instructed on use of ANORO.  Patient expressed understanding and demonstrated technique.  

## 2018-06-21 NOTE — Patient Instructions (Signed)
Trial of Anoro inhaler.  1 inhalation daily.  Use for 1 week, then off for 1 week then use second sample for 1 week.  If you feel that the medication improves her breathing, contact us and we will place a prescription for this medication  Smoking cessation as we discussed  Follow-up in 3 months with repeat CXR to follow the abnormalities on chest x-ray  Over time, you will need an annual CXR and PFTs to monitor COPD

## 2018-06-21 NOTE — Progress Notes (Signed)
PULMONARY OFFICE FOLLOW-UP NOTE  Requesting MD/Service: Self Date of initial consultation: 05/18/18 Reason for consultation: Recent hospitalization for pneumonia  PT PROFILE: 43 y.o. male smoker hospitalized 1/13-1/18/2020 with discharge diagnosis of cavitary pneumonia, protein-calorie malnutrition, alcohol abuse  DATA: 05/03/18 CT chest: Widespread patchy nodular peribronchovascular consolidation with scattered cavitary change and tree-in-bud opacity throughout both lungs involving all lung lobes. The largest nodular foci of cavitary consolidation are in the upper lobes bilaterally as detailed. Multilobar infectious bronchopneumonia is suspected, with atypical etiologies including tuberculosis or MAI not excluded 05/03/18 Quant gold: Negative 01/13-01/15/20: AFB smear negative x 3.  05/18/18 Quant gold:  Negative 05/18/18 AIAT level: Normal 06/15/18 PFTs: FVC: 4.25 > 4.47 L (90 > 95 %pred), FEV1: 2.07 > 2.10 L (54 > 55 %pred), FEV1/FVC: 49%, TLC: 7.89 L (124 %pred), DLCO 74 %pred    INTERVAL: No major events   SUBJ:  This is a scheduled follow-up.  He is here to review results of PFTs and repeat CXR.  He has no new complaints.  Overall, he feels that he is very close to his baseline.  He denies fever, purulent sputum, hemoptysis, chest pain and significant exertional dyspnea.  He has gained 8 pounds since last visit.  He is working hard on smoking cessation.  Presently he is smoking between 1 and 3 cigarettes/day.  Vitals:   06/21/18 0833 06/21/18 0834  BP:  118/78  Pulse:  90  SpO2:  97%  Weight: 118 lb (53.5 kg)   Height: 5\' 7"  (1.702 m)   Room air   EXAM:  Gen: Thin, not cachectic, NAD HEENT: NCAT, sclerae white Neck: No JVD Lungs: breath sounds mildly diminished without wheezes or other adventitious sounds Cardiovascular: RRR, no murmurs Abdomen: Soft, nontender, normal BS Ext: without clubbing, cyanosis, edema Neuro: grossly intact Skin: Limited exam, no lesions  noted   DATA:   BMP Latest Ref Rng & Units 05/07/2018 05/05/2018 05/04/2018  Glucose 70 - 99 mg/dL - 95 93  BUN 6 - 20 mg/dL - <1(H) <4(R)  Creatinine 0.61 - 1.24 mg/dL - 7.40(C) 1.44(Y)  Sodium 135 - 145 mmol/L - 132(L) 133(L)  Potassium 3.5 - 5.1 mmol/L 4.5 4.1 3.2(L)  Chloride 98 - 111 mmol/L - 104 99  CO2 22 - 32 mmol/L - 22 26  Calcium 8.9 - 10.3 mg/dL - 7.6(L) 7.6(L)    CBC Latest Ref Rng & Units 05/05/2018 05/04/2018 05/03/2018  WBC 4.0 - 10.5 K/uL 8.6 7.8 12.1(H)  Hemoglobin 13.0 - 17.0 g/dL 1.8(H) 6.3(J) 12.5(L)  Hematocrit 39.0 - 52.0 % 29.7(L) 29.4(L) 37.1(L)  Platelets 150 - 400 K/uL 410(H) 462(H) 615(H)    CXR 2/25: Opacity in LUL appears to be improving, scarring down.  RUL cavitary opacity persists  IMPRESSION:     ICD-10-CM   1. Smoker F17.200   2. resolving cavitary pneumonia R91.8 DG Chest 2 View  3. 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  2 samples provided.  1 inhalation daily.  He is instructed to use 1 week on, one-week off, 1 week.  If he feels that the medication improves his breathing contact us and we will place a prescription for this or equivalent  Smoking cessation was again discussed.  I emphasized the importance for complete abstinence to reduce the risk of relapsing to full-blown smoking  Follow-up in 3 months with repeat CXR prior to that visit  Over time, he will need annual CXR and PFTs as a minimum to monitor COPD/emphysema   Billy Fischer, MD PCCM service Mobile [phone] Pager [phone] 06/21/2018 10:27 AM

## 2018-11-08 ENCOUNTER — Emergency Department
Admission: EM | Admit: 2018-11-08 | Discharge: 2018-11-08 | Disposition: A | Discharge status: Home / Self Care | Payer: Self-pay | Attending: Emergency Medicine | Admitting: Emergency Medicine

## 2018-11-08 ENCOUNTER — Emergency Department: Payer: Self-pay

## 2018-11-08 ENCOUNTER — Other Ambulatory Visit: Payer: Self-pay

## 2018-11-08 DIAGNOSIS — Z20828 Contact with and (suspected) exposure to other viral communicable diseases: Secondary | ICD-10-CM | POA: Insufficient documentation

## 2018-11-08 DIAGNOSIS — F17210 Nicotine dependence, cigarettes, uncomplicated: Secondary | ICD-10-CM | POA: Insufficient documentation

## 2018-11-08 DIAGNOSIS — R0602 Shortness of breath: Secondary | ICD-10-CM

## 2018-11-08 DIAGNOSIS — R0600 Dyspnea, unspecified: Secondary | ICD-10-CM | POA: Insufficient documentation

## 2018-11-08 DIAGNOSIS — Z79899 Other long term (current) drug therapy: Secondary | ICD-10-CM | POA: Insufficient documentation

## 2018-11-08 LAB — CBC
HCT: 39.5 % (ref 39.0–52.0)
Hemoglobin: 13.4 g/dL (ref 13.0–17.0)
MCH: 29.6 pg (ref 26.0–34.0)
MCHC: 33.9 g/dL (ref 30.0–36.0)
MCV: 87.4 fL (ref 80.0–100.0)
Platelets: 218 10*3/uL (ref 150–400)
RBC: 4.52 MIL/uL (ref 4.22–5.81)
RDW: 15.2 % (ref 11.5–15.5)
WBC: 5.2 10*3/uL (ref 4.0–10.5)
nRBC: 0 % (ref 0.0–0.2)

## 2018-11-08 LAB — COMPREHENSIVE METABOLIC PANEL
ALT: 34 U/L (ref 0–44)
AST: 56 U/L — ABNORMAL HIGH (ref 15–41)
Albumin: 4.6 g/dL (ref 3.5–5.0)
Alkaline Phosphatase: 90 U/L (ref 38–126)
Anion gap: 19 — ABNORMAL HIGH (ref 5–15)
BUN: 8 mg/dL (ref 6–20)
CO2: 21 mmol/L — ABNORMAL LOW (ref 22–32)
Calcium: 9.6 mg/dL (ref 8.9–10.3)
Chloride: 101 mmol/L (ref 98–111)
Creatinine, Ser: 0.8 mg/dL (ref 0.61–1.24)
GFR calc Af Amer: >60 mL/min (ref >60)
GFR calc non Af Amer: >60 mL/min (ref >60)
Glucose, Bld: 116 mg/dL — ABNORMAL HIGH (ref 70–99)
Potassium: 4 mmol/L (ref 3.5–5.1)
Sodium: 141 mmol/L (ref 135–145)
Total Bilirubin: 1.9 mg/dL — ABNORMAL HIGH (ref 0.3–1.2)
Total Protein: 7.8 g/dL (ref 6.5–8.1)

## 2018-11-08 LAB — TROPONIN I (HIGH SENSITIVITY): Troponin I (High Sensitivity): 6 ng/L (ref <18)

## 2018-11-08 LAB — SARS CORONAVIRUS 2 BY RT PCR (HOSPITAL ORDER, PERFORMED IN ~~LOC~~ HOSPITAL LAB): SARS Coronavirus 2: NEGATIVE

## 2018-11-08 MED ORDER — LORAZEPAM 2 MG/ML IJ SOLN
1.0000 mg | Freq: Once | INTRAMUSCULAR | Status: AC
  Administered 2018-11-08: 14:00:00 1 mg via INTRAVENOUS
  Filled 2018-11-08: qty 1

## 2018-11-08 MED ORDER — HYDROXYZINE HCL 25 MG PO TABS: 25.0000 mg | ORAL_TABLET | Freq: Three times a day (TID) | ORAL | 0 refills | Status: DC | PRN

## 2018-11-08 NOTE — ED Provider Notes (Addendum)
[facility] Emergency Department Provider Note  Time seen: 1:30 PM  I have reviewed the triage vital signs and the nursing notes.   HISTORY  Chief Complaint Shortness of Breath and Tremors   HPI Shaun Ward is a 43 y.o. male with a past medical history of alcohol abuse, polysubstance abuse, COPD, presents to the emergency department for shortness of breath.   According to the patient he was at work today when approximately 2 to 3 hours ago he began feeling very short of breath.  Denies any chest pain.  Denies any fever.  Does state occasional cough but states that is fairly typical for him.  Upon arrival patient is very anxious appearing, very shaky, does admit to drinking a sixpack of alcohol daily, drank alcohol last night.  Past Medical History:  Diagnosis Date  . Alcohol abuse   . Polysubstance abuse (HCC)   . Tobacco abuse     Patient Active Problem List   Diagnosis Date Noted  . Protein-calorie malnutrition, severe 05/05/2018  . Pressure injury of skin 05/04/2018  . Pneumonia 05/03/2018    Past Surgical History:  Procedure Laterality Date  . HERNIA REPAIR      Prior to Admission medications   Medication Sig Start Date End Date Taking? Authorizing Provider  chlordiazePOXIDE (LIBRIUM) 10 MG capsule Take 1 tab PO TID X 2 days, 1 tab PO BID  X 2 days, then 1 tab PO Daily X 2 days and then STOP 05/08/18   Sainani, Rolly PancakeVivek J, MD  diphenhydramine-acetaminophen (TYLENOL PM) 25-500 MG TABS tablet Take 1 tablet by mouth at bedtime as needed.    [provider]    No Known Allergies  Family History  Problem Relation Age of Onset  . Diabetes Father   . CAD Paternal Grandmother     Social History Social History   Tobacco Use  . Smoking status: Current Every Day Smoker    Packs/day: 1.50    Types: Cigarettes  . Smokeless tobacco: Never Used  . Tobacco comment: currently down to 1-2 cigs a day, occassional chewing tobacco  Substance Use  Topics  . Alcohol use: Yes    Alcohol/week: 6.0 - 12.0 standard drinks    Types: 6 - 12 Cans of beer per week    Comment: daily  . Drug use: Not Currently    Review of Systems Constitutional: Negative for fever. ENT: Negative for recent illness/congestion Cardiovascular: Negative for chest pain. Respiratory: Positive for shortness of breath.  Positive for cough. Gastrointestinal: Negative for abdominal pain, vomiting Musculoskeletal: Negative for musculoskeletal complaints Skin: Negative for skin complaints  Neurological: Negative for headache All other ROS negative  ____________________________________________   PHYSICAL EXAM:  VITAL SIGNS: ED Triage Vitals  Enc Vitals Group     BP 11/08/18 1220 (!) 164/105     Pulse Rate 11/08/18 1220 (!) 121     Resp 11/08/18 1220 (!) 40     Temp 11/08/18 1220 98.6 F (37 C)     Temp Source 11/08/18 1220 Oral     SpO2 11/08/18 1220 100 %     Weight 11/08/18 1221 120 lb (54.4 kg)     Height 11/08/18 1221 5\' 7"  (1.702 m)     Head Circumference --      Peak Flow --      Pain Score 11/08/18 1220 0     Pain Loc --      Pain Edu? --      Excl. in  GC? --    Constitutional: Alert and oriented. Well appearing and in no distress. Eyes: Normal exam ENT      Head: Normocephalic and atraumatic.      Mouth/Throat: Mucous membranes are moist. Cardiovascular: Normal rate, regular rhythm. Respiratory: Normal respiratory effort without tachypnea nor retractions. Breath sounds are clear, without obvious wheeze rales or rhonchi. Gastrointestinal: Soft and nontender. No distention.  Musculoskeletal: Nontender with normal range of motion in all extremities. Neurologic:  Normal speech and language. No gross focal neurologic deficits  Skin:  Skin is warm, dry and intact.  Psychiatric: Mood and affect are normal.  ____________________________________________    EKG  EKG viewed and interpreted by myself shows sinus tachycardia 120 bpm with a  narrow QRS, normal axis, normal intervals, nonspecific ST changes.  ____________________________________________    RADIOLOGY  Chest x-ray shows stable cavitary nodule.  No other changes.  ____________________________________________   INITIAL IMPRESSION / ASSESSMENT AND PLAN / ED COURSE  Pertinent labs & imaging results that were available during my care of the patient were reviewed by me and considered in my medical decision making (see chart for details).   Patient presents to the emergency department with acute onset of shortness of breath and shaking while at work today.  Differential would include withdrawal symptoms, COPD, anxiety/panic, ACS, infectious etiology such as pneumonia or viral process.  We will check labs, swab for coronavirus, obtain a chest x-ray and continue to closely monitor.  Given the patient's tremors and history of alcoholism we will treat with 1 mg of IV Ativan and reassess.  Patient agreeable plan of care.  Patient appears much better, is no longer tremulous, patient's labs are resulted showing a negative troponin.  Patient's anion gap is slightly elevated, could represent possible mild alcoholic ketoacidosis.  Patient states he will orally hydrate at home.  We will discharge patient home with a short course of hydroxyzine to be used if needed.  Patient agreeable to plan of care.  Coronavirus swab pending however patient states he will check in on his my chart later.  Patient will be called if results are positive.  Shaun Ward was evaluated in Emergency Department on 11/08/2018 for the symptoms described in the history of present illness. He was evaluated in the context of the global COVID-19 pandemic, which necessitated consideration that the patient might be at risk for infection with the SARS-CoV-2 virus that causes COVID-19. Institutional protocols and algorithms that pertain to the evaluation of patients at risk for COVID-19 are in a state of rapid change  based on information released by regulatory bodies including the CDC and federal and state organizations. These policies and algorithms were followed during the patient's care in the ED.  ____________________________________________   FINAL CLINICAL IMPRESSION(S) / ED DIAGNOSES  Tremor Dyspnea   Harvest Dark, MD 11/08/18 1449    Harvest Dark, MD 11/08/18 1450

## 2018-11-08 NOTE — ED Triage Notes (Addendum)
C/o tremors and sudden onset of SOB while at work. Pt tremulous, hyperventilating at time of triage. Denies pain. Daily alcohol use, last use last night and normal amount.   Took 3 puffs of albuterol inhaler PTA with no relief.

## 2018-11-09 ENCOUNTER — Telehealth: Payer: Self-pay | Admitting: General Practice

## 2018-11-09 NOTE — Telephone Encounter (Signed)
Patient called with questions concerning his last ED visit. He questioned bc of his shortness of breath-dx in ED-anxiety-if Dr Genevive Bi would see him bc of his COPD. I did advise him that he would need to follow up with a pulmonologist or his PCP. Patient agreed.

## 2018-11-15 ENCOUNTER — Encounter: Payer: Self-pay | Admitting: Emergency Medicine

## 2018-11-15 ENCOUNTER — Emergency Department: Payer: Self-pay

## 2018-11-15 ENCOUNTER — Emergency Department
Admission: EM | Admit: 2018-11-15 | Discharge: 2018-11-15 | Disposition: A | Discharge status: Home / Self Care | Payer: Self-pay | Attending: Emergency Medicine | Admitting: Emergency Medicine

## 2018-11-15 ENCOUNTER — Other Ambulatory Visit: Payer: Self-pay

## 2018-11-15 DIAGNOSIS — F410 Panic disorder [episodic paroxysmal anxiety] without agoraphobia: Secondary | ICD-10-CM | POA: Insufficient documentation

## 2018-11-15 DIAGNOSIS — R0602 Shortness of breath: Secondary | ICD-10-CM | POA: Insufficient documentation

## 2018-11-15 DIAGNOSIS — F17210 Nicotine dependence, cigarettes, uncomplicated: Secondary | ICD-10-CM | POA: Insufficient documentation

## 2018-11-15 DIAGNOSIS — R0789 Other chest pain: Secondary | ICD-10-CM | POA: Insufficient documentation

## 2018-11-15 DIAGNOSIS — R450 Nervousness: Secondary | ICD-10-CM | POA: Insufficient documentation

## 2018-11-15 LAB — TSH: TSH: 2.339 u[IU]/mL (ref 0.350–4.500)

## 2018-11-15 LAB — TROPONIN I (HIGH SENSITIVITY): Troponin I (High Sensitivity): 7 ng/L (ref <18)

## 2018-11-15 MED ORDER — LORAZEPAM 2 MG/ML IJ SOLN
1.0000 mg | Freq: Once | INTRAMUSCULAR | Status: AC
  Administered 2018-11-15: 12:00:00 1 mg via INTRAVENOUS
  Filled 2018-11-15: qty 1

## 2018-11-15 NOTE — ED Notes (Signed)
Pt waiting in lobby for ride.

## 2018-11-15 NOTE — ED Notes (Signed)
Pt c/o anxiety. Admits to ETOH, denies going through DTs or withdrawing. PT states he feels like he is having panic attack, pt + tremors. MD at bedside. PT in NAD

## 2018-11-15 NOTE — ED Provider Notes (Signed)
[facility] Emergency Department Provider Note   ____________________________________________   First MD Initiated Contact with Patient 11/15/18 1155     (approximate)  I have reviewed the triage vital signs and the nursing notes.   HISTORY  Chief Complaint Panic Attack    HPI Shaun Ward is a 43 y.o. male who reports he had a panic attack on Monday.  This was his first 1.  He was seen here for it.  He improved with Ativan but has been unable to get the prescription for his medicines filled.  He did not have enough money.  He has been having another panic attack today she is shaking jittery feeling short of breath.  Is also having some chest tightness.  This just like last week.  Patient reports he is always been an anxious person but he is never had a panic attack until last Monday.  There is nothing particularly bad going on in his life now.  His work is doing well.         Past Medical History:  Diagnosis Date  . Alcohol abuse   . Polysubstance abuse (HCC)   . Tobacco abuse     Patient Active Problem List   Diagnosis Date Noted  . Protein-calorie malnutrition, severe 05/05/2018  . Pressure injury of skin 05/04/2018  . Pneumonia 05/03/2018    Past Surgical History:  Procedure Laterality Date  . HERNIA REPAIR      Prior to Admission medications   Medication Sig Start Date End Date Taking? Authorizing Provider  chlordiazePOXIDE (LIBRIUM) 10 MG capsule Take 1 tab PO TID X 2 days, 1 tab PO BID  X 2 days, then 1 tab PO Daily X 2 days and then STOP 05/08/18   Sainani, Rolly PancakeVivek J, MD  diphenhydramine-acetaminophen (TYLENOL PM) 25-500 MG TABS tablet Take 1 tablet by mouth at bedtime as needed.    [provider]  hydrOXYzine (ATARAX/VISTARIL) 25 MG tablet Take 1 tablet (25 mg total) by mouth 3 (three) times daily as needed for anxiety. 11/08/18   Minna AntisPaduchowski, Kevin, MD    Allergies Patient has no known allergies.  Family History   Problem Relation Age of Onset  . Diabetes Father   . CAD Paternal Grandmother     Social History Social History   Tobacco Use  . Smoking status: Current Every Day Smoker    Packs/day: 1.50    Types: Cigarettes  . Smokeless tobacco: Never Used  . Tobacco comment: currently down to 1-2 cigs a day, occassional chewing tobacco  Substance Use Topics  . Alcohol use: Yes    Alcohol/week: 6.0 - 12.0 standard drinks    Types: 6 - 12 Cans of beer per week    Comment: daily  . Drug use: Not Currently    Review of Systems  Constitutional: No fever/chills Eyes: No visual changes. ENT: No sore throat. Cardiovascular: See HPI Respiratory:shortness of breath. Gastrointestinal: No abdominal pain.  No nausea, no vomiting.  No diarrhea.  No constipation. Genitourinary: Negative for dysuria. Musculoskeletal: Negative for back pain. Skin: Negative for rash. Neurological: Negative for headaches, focal weakness   ____________________________________________   PHYSICAL EXAM:  VITAL SIGNS: ED Triage Vitals  Enc Vitals Group     BP 11/15/18 1104 (!) 153/128     Pulse Rate 11/15/18 1100 (!) 106     Resp 11/15/18 1100 (!) 26     Temp --      Temp src --  SpO2 11/15/18 1100 100 %     Weight 11/15/18 1103 120 lb (54.4 kg)     Height 11/15/18 1103 5\' 6"  (1.676 m)     Head Circumference --      Peak Flow --      Pain Score 11/15/18 1101 5     Pain Loc --      Pain Edu? --      Excl. in [place]? --     Constitutional: Alert and oriented.  Very anxious and tremulous Eyes: Conjunctivae are normal. PER. EOMI. Head: Atraumatic. Nose: No congestion/rhinnorhea. Mouth/Throat: Mucous membranes are moist.  Oropharynx non-erythematous. Neck: No stridor.  { Cardiovascular: Normal rate, regular rhythm. Grossly normal heart sounds.  Good peripheral circulation. Respiratory: Normal respiratory effort.  No retractions. Lungs CTAB. Gastrointestinal: Soft and nontender. No distention. No abdominal  bruits. No CVA tenderness. Musculoskeletal: No lower extremity tenderness nor edema.   Neurologic:  Normal speech and language. No gross focal neurologic deficits are appreciated. No gait instability. Skin:  Skin is warm, dry and intact. No rash noted.   ____________________________________________   LABS (all labs ordered are listed, but only abnormal results are displayed)  Labs Reviewed  TSH  TROPONIN I (HIGH SENSITIVITY)  TROPONIN I (HIGH SENSITIVITY)   ____________________________________________  EKG  EKG read and interpreted by me shows sinus tachycardia rate of 104 normal axis very bad baseline mostly due to muscle artifact no ST-T wave changes are identified ____________________________________________  RADIOLOGY  ED MD interpretation: Chest x-ray read by radiology reviewed by me looks better compared to previous CT scan done in January when patient was admitted for multi lobar pneumonia with cavitation.  Patient's CBC from 2 days ago was normal.  Patient is no longer short of breath.  He is not running a fever.  I think we will be okay letting him go.  I will have him come back if he gets worse at all.  Official radiology report(s): Dg Chest 2 View  Result Date: 11/15/2018 CLINICAL DATA:  Shortness of breath EXAM: CHEST - 2 VIEW COMPARISON:  November 08, 2018 FINDINGS: The previously noted cavitary nodule in the right upper lobe is again noted, although the cavitary portion is not well seen currently. This nodular opacity currently measures 1.1 x 0.9 cm. There is underlying scattered scarring and hyperexpansion. No new airspace opacity. Heart size and pulmonary vascularity are normal. No adenopathy. Sclerotic focus in the proximal right humerus again noted. IMPRESSION: Nodular opacity in the right upper lobe is again noted with less cavitation the slightly more overall opacity compared to recent study. Underlying hyperexpansion and areas of scarring with probable degree of  underlying emphysema. No airspace consolidation. Stable cardiac silhouette. No evident adenopathy. Question bone infarct versus enchondroma proximal right humerus. Electronically Signed   By: Lowella Grip III M.D.   On: 11/15/2018 13:12    ____________________________________________   PROCEDURES  Procedure(s) performed (including Critical Care):  Procedures   ____________________________________________   INITIAL IMPRESSION / ASSESSMENT AND PLAN / ED COURSE Patient much better after Ativan.  We will let him go.  I will give him follow-up with [facility] gave him the information for that.  He can always return if he is worse.  He has this prescription for Librium already.              ____________________________________________   FINAL CLINICAL IMPRESSION(S) / ED DIAGNOSES  Final diagnoses:  Panic attack     ED Discharge Orders    None  Note:  This document was prepared using Dragon voice recognition software and may include unintentional dictation errors.    Arnaldo NatalMalinda,  F, MD 11/15/18 1600

## 2018-11-15 NOTE — Discharge Instructions (Addendum)
Please try to get your prescription filled.  Take it as directed.  Please follow-up with [facility].  You can call and make an appointment or just walk-in.  Their contact information is on the other sheet of paper we gave you.  Please return as needed.

## 2018-11-15 NOTE — ED Notes (Signed)
Assumed care of patient, patient presents with c/o of sob. Seen here last week for same as per patient dx with panic attacks. Discharged with rx for anxiety. Patient reports unable to fill rx due to lack of funds. Awaiting md eval. Safety maintained.

## 2018-11-15 NOTE — ED Triage Notes (Signed)
Pt here with c/o panic attack, hyperventilating at triage, states it "just came on." States his whole back hurts, "my body is tingling." Able to calm with coaching. NAD.

## 2018-11-15 NOTE — ED Notes (Signed)
Pt states he was given a prescription to take at home, however, hasn't picked up due to money.

## 2019-06-16 ENCOUNTER — Observation Stay: Payer: Self-pay

## 2019-06-16 ENCOUNTER — Inpatient Hospital Stay
Admission: EM | Admit: 2019-06-16 | Discharge: 2019-06-20 | DRG: 897 | Disposition: A | Discharge status: Home Health | Payer: Self-pay | Attending: Internal Medicine | Admitting: Internal Medicine

## 2019-06-16 ENCOUNTER — Emergency Department: Payer: Self-pay

## 2019-06-16 ENCOUNTER — Other Ambulatory Visit: Payer: Self-pay

## 2019-06-16 DIAGNOSIS — S329XXA Fracture of unspecified parts of lumbosacral spine and pelvis, initial encounter for closed fracture: Secondary | ICD-10-CM | POA: Diagnosis present

## 2019-06-16 DIAGNOSIS — R54 Age-related physical debility: Secondary | ICD-10-CM | POA: Diagnosis present

## 2019-06-16 DIAGNOSIS — Z20822 Contact with and (suspected) exposure to covid-19: Secondary | ICD-10-CM | POA: Diagnosis present

## 2019-06-16 DIAGNOSIS — F17210 Nicotine dependence, cigarettes, uncomplicated: Secondary | ICD-10-CM | POA: Diagnosis present

## 2019-06-16 DIAGNOSIS — S32512A Fracture of superior rim of left pubis, initial encounter for closed fracture: Secondary | ICD-10-CM | POA: Diagnosis present

## 2019-06-16 DIAGNOSIS — F10232 Alcohol dependence with withdrawal with perceptual disturbance: Principal | ICD-10-CM | POA: Diagnosis present

## 2019-06-16 DIAGNOSIS — F10939 Alcohol use, unspecified with withdrawal, unspecified: Secondary | ICD-10-CM | POA: Diagnosis present

## 2019-06-16 DIAGNOSIS — S3282XA Multiple fractures of pelvis without disruption of pelvic ring, initial encounter for closed fracture: Secondary | ICD-10-CM

## 2019-06-16 DIAGNOSIS — J439 Emphysema, unspecified: Secondary | ICD-10-CM | POA: Diagnosis present

## 2019-06-16 DIAGNOSIS — F1910 Other psychoactive substance abuse, uncomplicated: Secondary | ICD-10-CM | POA: Diagnosis present

## 2019-06-16 DIAGNOSIS — S32119A Unspecified Zone I fracture of sacrum, initial encounter for closed fracture: Secondary | ICD-10-CM | POA: Diagnosis present

## 2019-06-16 DIAGNOSIS — W06XXXA Fall from bed, initial encounter: Secondary | ICD-10-CM | POA: Diagnosis present

## 2019-06-16 DIAGNOSIS — D696 Thrombocytopenia, unspecified: Secondary | ICD-10-CM | POA: Diagnosis present

## 2019-06-16 DIAGNOSIS — Y92009 Unspecified place in unspecified non-institutional (private) residence as the place of occurrence of the external cause: Secondary | ICD-10-CM

## 2019-06-16 DIAGNOSIS — Y9389 Activity, other specified: Secondary | ICD-10-CM

## 2019-06-16 DIAGNOSIS — Z8249 Family history of ischemic heart disease and other diseases of the circulatory system: Secondary | ICD-10-CM

## 2019-06-16 DIAGNOSIS — E876 Hypokalemia: Secondary | ICD-10-CM | POA: Diagnosis present

## 2019-06-16 DIAGNOSIS — Z833 Family history of diabetes mellitus: Secondary | ICD-10-CM

## 2019-06-16 DIAGNOSIS — F1010 Alcohol abuse, uncomplicated: Secondary | ICD-10-CM | POA: Diagnosis present

## 2019-06-16 DIAGNOSIS — M6289 Other specified disorders of muscle: Secondary | ICD-10-CM | POA: Diagnosis present

## 2019-06-16 DIAGNOSIS — Y92013 Bedroom of single-family (private) house as the place of occurrence of the external cause: Secondary | ICD-10-CM

## 2019-06-16 DIAGNOSIS — F10230 Alcohol dependence with withdrawal, uncomplicated: Secondary | ICD-10-CM

## 2019-06-16 DIAGNOSIS — D61818 Other pancytopenia: Secondary | ICD-10-CM | POA: Diagnosis present

## 2019-06-16 DIAGNOSIS — F10930 Alcohol use, unspecified with withdrawal, uncomplicated: Secondary | ICD-10-CM | POA: Insufficient documentation

## 2019-06-16 DIAGNOSIS — F10239 Alcohol dependence with withdrawal, unspecified: Secondary | ICD-10-CM | POA: Diagnosis present

## 2019-06-16 DIAGNOSIS — Z720 Tobacco use: Secondary | ICD-10-CM | POA: Diagnosis present

## 2019-06-16 DIAGNOSIS — S32502A Unspecified fracture of left pubis, initial encounter for closed fracture: Secondary | ICD-10-CM

## 2019-06-16 DIAGNOSIS — R17 Unspecified jaundice: Secondary | ICD-10-CM | POA: Diagnosis present

## 2019-06-16 DIAGNOSIS — W19XXXA Unspecified fall, initial encounter: Secondary | ICD-10-CM | POA: Diagnosis present

## 2019-06-16 HISTORY — DX: Emphysema, unspecified: J43.9

## 2019-06-16 LAB — URINE DRUG SCREEN, QUALITATIVE (ARMC ONLY)
Amphetamines, Ur Screen: NOT DETECTED
Barbiturates, Ur Screen: NOT DETECTED
Benzodiazepine, Ur Scrn: POSITIVE — AB
Cannabinoid 50 Ng, Ur ~~LOC~~: POSITIVE — AB
Cocaine Metabolite,Ur ~~LOC~~: NOT DETECTED
MDMA (Ecstasy)Ur Screen: NOT DETECTED
Methadone Scn, Ur: NOT DETECTED
Opiate, Ur Screen: NOT DETECTED
Phencyclidine (PCP) Ur S: NOT DETECTED
Tricyclic, Ur Screen: NOT DETECTED

## 2019-06-16 LAB — CBC WITH DIFFERENTIAL/PLATELET
Abs Immature Granulocytes: 0.03 10*3/uL (ref 0.00–0.07)
Basophils Absolute: 0 10*3/uL (ref 0.0–0.1)
Basophils Relative: 1 %
Eosinophils Absolute: 0.1 10*3/uL (ref 0.0–0.5)
Eosinophils Relative: 3 %
HCT: 35.4 % — ABNORMAL LOW (ref 39.0–52.0)
Hemoglobin: 11.5 g/dL — ABNORMAL LOW (ref 13.0–17.0)
Immature Granulocytes: 1 %
Lymphocytes Relative: 27 %
Lymphs Abs: 1.1 10*3/uL (ref 0.7–4.0)
MCH: 31.3 pg (ref 26.0–34.0)
MCHC: 32.5 g/dL (ref 30.0–36.0)
MCV: 96.2 fL (ref 80.0–100.0)
Monocytes Absolute: 0.5 10*3/uL (ref 0.1–1.0)
Monocytes Relative: 11 %
Neutro Abs: 2.5 10*3/uL (ref 1.7–7.7)
Neutrophils Relative %: 57 %
Platelets: 111 10*3/uL — ABNORMAL LOW (ref 150–400)
RBC: 3.68 MIL/uL — ABNORMAL LOW (ref 4.22–5.81)
RDW: 15.5 % (ref 11.5–15.5)
WBC: 4.3 10*3/uL (ref 4.0–10.5)
nRBC: 0 % (ref 0.0–0.2)

## 2019-06-16 LAB — LACTATE DEHYDROGENASE: LDH: 147 U/L (ref 98–192)

## 2019-06-16 LAB — URINALYSIS, COMPLETE (UACMP) WITH MICROSCOPIC
Bacteria, UA: NONE SEEN
Bilirubin Urine: NEGATIVE
Glucose, UA: NEGATIVE mg/dL
Hgb urine dipstick: NEGATIVE
Ketones, ur: 5 mg/dL — AB
Leukocytes,Ua: NEGATIVE
Nitrite: NEGATIVE
Protein, ur: NEGATIVE mg/dL
Specific Gravity, Urine: 1.009 (ref 1.005–1.030)
Squamous Epithelial / HPF: NONE SEEN (ref 0–5)
pH: 6 (ref 5.0–8.0)

## 2019-06-16 LAB — BASIC METABOLIC PANEL
Anion gap: 12 (ref 5–15)
BUN: 9 mg/dL (ref 6–20)
CO2: 24 mmol/L (ref 22–32)
Calcium: 8.3 mg/dL — ABNORMAL LOW (ref 8.9–10.3)
Chloride: 99 mmol/L (ref 98–111)
Creatinine, Ser: 0.96 mg/dL (ref 0.61–1.24)
GFR calc Af Amer: >60 mL/min (ref >60)
GFR calc non Af Amer: >60 mL/min (ref >60)
Glucose, Bld: 106 mg/dL — ABNORMAL HIGH (ref 70–99)
Potassium: 3.1 mmol/L — ABNORMAL LOW (ref 3.5–5.1)
Sodium: 135 mmol/L (ref 135–145)

## 2019-06-16 LAB — SARS CORONAVIRUS 2 (TAT 6-24 HRS): SARS Coronavirus 2: NEGATIVE

## 2019-06-16 LAB — CK: Total CK: 59 U/L (ref 49–397)

## 2019-06-16 LAB — MAGNESIUM: Magnesium: 1.6 mg/dL — ABNORMAL LOW (ref 1.7–2.4)

## 2019-06-16 LAB — PATHOLOGIST SMEAR REVIEW

## 2019-06-16 LAB — SAVE SMEAR(SSMR), FOR PROVIDER SLIDE REVIEW: Smear Review: NORMAL

## 2019-06-16 LAB — ETHANOL: Alcohol, Ethyl (B): <10 mg/dL (ref <10)

## 2019-06-16 LAB — HIV ANTIBODY (ROUTINE TESTING W REFLEX): HIV Screen 4th Generation wRfx: NONREACTIVE

## 2019-06-16 MED ORDER — POTASSIUM CHLORIDE CRYS ER 20 MEQ PO TBCR
60.0000 meq | EXTENDED_RELEASE_TABLET | Freq: Once | ORAL | Status: AC
  Administered 2019-06-16: 10:00:00 60 meq via ORAL
  Filled 2019-06-16: qty 3

## 2019-06-16 MED ORDER — LORAZEPAM 1 MG PO TABS
1.0000 mg | ORAL_TABLET | ORAL | Status: AC | PRN
  Administered 2019-06-17: 08:00:00 1 mg via ORAL
  Filled 2019-06-16: qty 1

## 2019-06-16 MED ORDER — CHLORDIAZEPOXIDE HCL 25 MG PO CAPS
50.0000 mg | ORAL_CAPSULE | Freq: Once | ORAL | Status: AC
  Administered 2019-06-16: 08:00:00 50 mg via ORAL
  Filled 2019-06-16: qty 2

## 2019-06-16 MED ORDER — ALBUTEROL SULFATE HFA 108 (90 BASE) MCG/ACT IN AERS
2.0000 | INHALATION_SPRAY | RESPIRATORY_TRACT | Status: DC | PRN
  Filled 2019-06-16: qty 6.7

## 2019-06-16 MED ORDER — ADULT MULTIVITAMIN W/MINERALS CH
1.0000 | ORAL_TABLET | Freq: Every day | ORAL | Status: DC
  Administered 2019-06-16 – 2019-06-20 (×5): 1 via ORAL
  Filled 2019-06-16 (×5): qty 1

## 2019-06-16 MED ORDER — SENNOSIDES-DOCUSATE SODIUM 8.6-50 MG PO TABS: 1.0000 | ORAL_TABLET | Freq: Every evening | ORAL | Status: DC | PRN

## 2019-06-16 MED ORDER — DM-GUAIFENESIN ER 30-600 MG PO TB12
1.0000 | ORAL_TABLET | Freq: Two times a day (BID) | ORAL | Status: DC
  Administered 2019-06-16 – 2019-06-20 (×7): 1 via ORAL
  Filled 2019-06-16 (×8): qty 1

## 2019-06-16 MED ORDER — NICOTINE 21 MG/24HR TD PT24
21.0000 mg | MEDICATED_PATCH | Freq: Every day | TRANSDERMAL | Status: DC
  Administered 2019-06-16 – 2019-06-20 (×5): 21 mg via TRANSDERMAL
  Filled 2019-06-16 (×5): qty 1

## 2019-06-16 MED ORDER — LORAZEPAM 2 MG/ML IJ SOLN
0.0000 mg | Freq: Two times a day (BID) | INTRAMUSCULAR | Status: AC
  Administered 2019-06-18 (×2): 2 mg via INTRAVENOUS
  Administered 2019-06-19 (×2): 1 mg via INTRAVENOUS
  Filled 2019-06-16 (×4): qty 1

## 2019-06-16 MED ORDER — FOLIC ACID 1 MG PO TABS
1.0000 mg | ORAL_TABLET | Freq: Every day | ORAL | Status: DC
  Administered 2019-06-16 – 2019-06-20 (×5): 1 mg via ORAL
  Filled 2019-06-16 (×5): qty 1

## 2019-06-16 MED ORDER — LORAZEPAM 2 MG/ML IJ SOLN
2.0000 mg | Freq: Once | INTRAMUSCULAR | Status: AC
  Administered 2019-06-16: 06:00:00 2 mg via INTRAVENOUS
  Filled 2019-06-16: qty 1

## 2019-06-16 MED ORDER — THIAMINE HCL 100 MG/ML IJ SOLN
100.0000 mg | Freq: Every day | INTRAMUSCULAR | Status: DC
  Administered 2019-06-17: 10:00:00 100 mg via INTRAVENOUS
  Filled 2019-06-16: qty 2

## 2019-06-16 MED ORDER — LORAZEPAM 2 MG/ML IJ SOLN
1.0000 mg | INTRAMUSCULAR | Status: AC | PRN
  Administered 2019-06-16 – 2019-06-18 (×3): 2 mg via INTRAVENOUS
  Filled 2019-06-16 (×3): qty 1

## 2019-06-16 MED ORDER — OXYCODONE-ACETAMINOPHEN 5-325 MG PO TABS
1.0000 | ORAL_TABLET | ORAL | Status: DC | PRN
  Administered 2019-06-16 – 2019-06-20 (×5): 1 via ORAL
  Filled 2019-06-16 (×5): qty 1

## 2019-06-16 MED ORDER — SODIUM CHLORIDE 0.9 % IV SOLN: INTRAVENOUS | Status: DC

## 2019-06-16 MED ORDER — LORAZEPAM 2 MG/ML IJ SOLN
0.0000 mg | Freq: Four times a day (QID) | INTRAMUSCULAR | Status: AC
  Administered 2019-06-16: 16:00:00 2 mg via INTRAVENOUS
  Administered 2019-06-16: 1 mg via INTRAVENOUS
  Administered 2019-06-17: 20:00:00 2 mg via INTRAVENOUS
  Filled 2019-06-16 (×5): qty 1

## 2019-06-16 MED ORDER — MAGNESIUM SULFATE 2 GM/50ML IV SOLN
2.0000 g | Freq: Once | INTRAVENOUS | Status: AC
  Administered 2019-06-16: 2 g via INTRAVENOUS
  Filled 2019-06-16: qty 50

## 2019-06-16 MED ORDER — SODIUM CHLORIDE 0.9 % IV BOLUS: 1000.0000 mL | Freq: Once | INTRAVENOUS | Status: DC

## 2019-06-16 MED ORDER — METHOCARBAMOL 500 MG PO TABS
500.0000 mg | ORAL_TABLET | Freq: Three times a day (TID) | ORAL | Status: DC | PRN
  Administered 2019-06-17 – 2019-06-18 (×2): 500 mg via ORAL
  Filled 2019-06-16 (×3): qty 1

## 2019-06-16 MED ORDER — PANTOPRAZOLE SODIUM 40 MG PO TBEC
40.0000 mg | DELAYED_RELEASE_TABLET | Freq: Every day | ORAL | Status: DC
  Administered 2019-06-16 – 2019-06-19 (×4): 40 mg via ORAL
  Filled 2019-06-16 (×4): qty 1

## 2019-06-16 MED ORDER — THIAMINE HCL 100 MG PO TABS
100.0000 mg | ORAL_TABLET | Freq: Every day | ORAL | Status: DC
  Administered 2019-06-16 – 2019-06-20 (×4): 100 mg via ORAL
  Filled 2019-06-16 (×5): qty 1

## 2019-06-16 MED ORDER — SODIUM CHLORIDE 0.9 % IV BOLUS
1000.0000 mL | Freq: Once | INTRAVENOUS | Status: AC
  Administered 2019-06-16: 06:00:00 1000 mL via INTRAVENOUS

## 2019-06-16 MED ORDER — MORPHINE SULFATE (PF) 2 MG/ML IV SOLN
2.0000 mg | INTRAVENOUS | Status: DC | PRN
  Administered 2019-06-16 – 2019-06-19 (×7): 2 mg via INTRAVENOUS
  Filled 2019-06-16 (×7): qty 1

## 2019-06-16 MED ORDER — ONDANSETRON HCL 4 MG/2ML IJ SOLN
4.0000 mg | Freq: Three times a day (TID) | INTRAMUSCULAR | Status: DC | PRN
  Administered 2019-06-18: 08:00:00 4 mg via INTRAVENOUS
  Filled 2019-06-16: qty 2

## 2019-06-16 NOTE — ED Notes (Signed)
Pt taken to CT.

## 2019-06-16 NOTE — Progress Notes (Signed)
Called by ED staff regarding patient's pelvic ring fractures. XR and CT scan were reviewed. There are fractures of the L superior and inferior pubic rami and a L sacral ala fracture without significant comminution. This is a stable fracture pattern that will usually heal without intervention.  The patient can be WBAT on the LLE. Recommend PT/OT as able. He can follow up with [facility] approximately 2 weeks after discha rge. Please page with any questions.

## 2019-06-16 NOTE — Evaluation (Signed)
Physical Therapy Evaluation Patient Details Name: Shaun Ward MRN: [id number] DOB: Sep 04, 1975 Today's Date: 06/16/2019   History of Present Illness  Pt is 44 yo male s/p fall at home, X-ray of left hip and CT-pelvis showed fractures of the L superior and inferior pubic rami and a L sacral ala fracture, and Extraperitoneal hemorrhage, largely centered upon the superior ramus/pubic body fracture with hemorrhage tracking into the sub peritoneal space and space of Retzius as well as along the left pelvic sidewall. Fluid in hemorrhage displaces the bladder, and small amount of intramuscular hemorrhage within the left operator internus likely rising from the inferior ramus fracture. per ortho consult pt can be WBAT on LLE. PMH of polysubstance abuse, etoh abuse, emphysema of lung.    Clinical Impression  Pt woke to touch at start of session, oriented x4, reported 8/10 hip pain, premedicated prior to session. Pt stated at baseline he is independent with ADLs/IADLs, drives but is not currently working. Stated he has had 3 falls in the last week. He lives with his mother in a one story home.  Upon assessment the patient demonstrated UE strength WFLs, tremors noted. RLE strengh grossly 4/5, LLE strength limited due to pain. Supine to sit attempted, very painful; instructed in log rolling technique, modA to complete. Pt exhibited fair sitting balance, able to sit for several minutes with supervision. Sit <> stand with RW and minA, minA for initial standing balance. Pt with inability to weight bear through LLE, foot not flat on the ground due to increased pain. Pt impulsive with mobility, given step by step sequencing cues to maximize safety and independence. Pt exhibited difficulty with TKE bilaterally due to pain. Pt was able to transfer to recliner, and after a seated rest break, ambulate a short distance in the room. Instructed in step to gait pattern for pain management, would benefit from further  instruction.  Overall the patient demonstrated deficits (see "PT Problem List") that impede the patient's functional abilities, safety, and mobility and would benefit from skilled PT intervention. PT recommend transition to acute inpatient rehab upon discharge for high-intensity, post-acute rehab services due to acute decline from PLOF, pt motivation, and excellent family support.      Follow Up Recommendations CIR    Equipment Recommendations  Rolling walker with 5" wheels    Recommendations for Other Services OT consult     Precautions / Restrictions Precautions Precautions: Fall;Other (comment) Precaution Comments: superior and inferior pubic rami fx (L), ala fx; extraperitoneal hemorrhage Restrictions Weight Bearing Restrictions: Yes LLE Weight Bearing: Weight bearing as tolerated      Mobility  Bed Mobility Overal bed mobility: Needs Assistance Bed Mobility: Rolling;Supine to Sit;Sit to Sidelying Rolling: Min assist   Supine to sit: Mod assist;HOB elevated   Sit to sidelying: HOB elevated;Mod assist;+2 for safety/equipment General bed mobility comments: Pt unable to perform supine to sit due to elevated pain. instructed in log rolling technique, modA to complete pt still complained of significant pain with movement  Transfers Overall transfer level: Needs assistance Equipment used: Rolling walker (2 wheeled) Transfers: Sit to/from Stand Sit to Stand: Min assist;From elevated surface;+2 safety/equipment         General transfer comment: minA to perform sit <> stand with RW, able to maintain static standing with minA  Ambulation/Gait   Gait Distance (Feet): 5 Feet Assistive device: Rolling walker (2 wheeled)       General Gait Details: Pt with inability to weight bear through LLE, foot not flat on  the ground due to increased pain. Pt impulsive with mobility, given step by step sequencing cues to maximize safety and independence. Pt exhibited difficulty with TKE  bilaterally due to pain.  Stairs            Wheelchair Mobility    Modified Rankin (Stroke Patients Only)       Balance Overall balance assessment: Needs assistance Sitting-balance support: Feet supported Sitting balance-Leahy Scale: Fair       Standing balance-Leahy Scale: Poor                               Pertinent Vitals/Pain Pain Assessment: 0-10 Pain Score: 8  Pain Location: 4/10 when sitting Pain Descriptors / Indicators: Aching;Grimacing;Moaning Pain Intervention(s): Limited activity within patient's tolerance;Monitored during session;Premedicated before session;Repositioned    Home Living Family/patient expects to be discharged to:: Private residence Living Arrangements: Parent Available Help at Discharge: Available 24 hours/day Type of Home: House Home Access: Level entry     Home Layout: One level Home Equipment: None Additional Comments: 3 falls this week, does endorse LE weakness    Prior Function Level of Independence: Independent         Comments: 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, does Administrator, sports        Extremity/Trunk Assessment   Upper Extremity Assessment Upper Extremity Assessment: Defer to OT evaluation;Overall WFL for tasks assessed    Lower Extremity Assessment Lower Extremity Assessment: RLE deficits/detail;LLE deficits/detail RLE Deficits / Details: grossly 4+/5 LLE Deficits / Details: ankle DF/PF 4+5, knee flexion 4/5, knee extension 4-/5 and painful, hip flexion pain with very gentle pressure    Cervical / Trunk Assessment Cervical / Trunk Assessment: Normal  Communication   Communication: No difficulties  Cognition Arousal/Alertness: Awake/alert Behavior During Therapy: WFL for tasks assessed/performed Overall Cognitive Status: Within Functional Limits for tasks assessed                                 General Comments: oriented x4. Tremors noted, behavior WFLs. initially  a bit lethargic, but with mobility awake/alert      General Comments General comments (skin integrity, edema, etc.): tremors noted; HR in 100s-120s with mobility    Exercises     Assessment/Plan    PT Assessment Patient needs continued PT services  PT Problem List Decreased strength;Decreased mobility;Decreased range of motion;Decreased activity tolerance;Decreased balance;Pain;Decreased knowledge of use of DME       PT Treatment Interventions DME instruction;Therapeutic exercise;Gait training;Balance training;Stair training;Neuromuscular re-education;Functional mobility training;Therapeutic activities;Patient/family education    PT Goals (Current goals can be found in the Care Plan section)  Acute Rehab PT Goals Patient Stated Goal: to decrease pain PT Goal Formulation: With patient Time For Goal Achievement: 06/30/19 Potential to Achieve Goals: Good    Frequency 7X/week   Barriers to discharge        Co-evaluation PT/OT/SLP Co-Evaluation/Treatment: Yes Reason for Co-Treatment: Necessary to address cognition/behavior during functional activity;For patient/therapist safety;To address functional/ADL transfers PT goals addressed during session: Mobility/safety with mobility;Balance;Proper use of DME OT goals addressed during session: Proper use of Adaptive equipment and DME;ADL's and self-care       AM-PAC PT "6 Clicks" Mobility  Outcome Measure Help needed turning from your back to your side while in a flat bed without using bedrails?: A Lot Help needed moving from lying on your  back to sitting on the side of a flat bed without using bedrails?: A Lot Help needed moving to and from a bed to a chair (including a wheelchair)?: A Lot Help needed standing up from a chair using your arms (e.g., wheelchair or bedside chair)?: A Little Help needed to walk in hospital room?: A Lot Help needed climbing 3-5 steps with a railing? : Total 6 Click Score: 12    End of Session  Equipment Utilized During Treatment: Gait belt Activity Tolerance: Patient limited by pain Patient left: in chair;with chair alarm set;with call bell/phone within reach Nurse Communication: Mobility status PT Visit Diagnosis: Muscle weakness (generalized) (M62.81);History of falling (Z91.81);Difficulty in walking, not elsewhere classified (R26.2);Pain;Other abnormalities of gait and mobility (R26.89) Pain - Right/Left: Left Pain - part of body: Hip    Time: 3795-5831 PT Time Calculation (min) (ACUTE ONLY): 30 min   Charges:   PT Evaluation $PT Eval Low Complexity: 1 Low PT Treatments $Gait Training: 8-22 mins       Olga Coaster PT, DPT 3:51 PM,06/16/19

## 2019-06-16 NOTE — Progress Notes (Signed)
PT Cancellation Note  Patient Details Name: Shaun Ward MRN: [id number] DOB: [birth year]   Cancelled Treatment:    Reason Eval/Treat Not Completed: Fatigue/lethargy limiting ability to participate Patient came to ED s/p fall with left hip pain. CT scan shows pelvic fracture and sacral compression fracture. Checked with RN and nursing requested that therapy hold this morning as patient scored 11 on CIWA and was given ativan and has been sleeping. RN requested PT wait until this afternoon when patient medically ready to participate with therapy.    Antonio Creswell PT, DPT 06/16/2019, 11:46 AM

## 2019-06-16 NOTE — ED Provider Notes (Signed)
[facility] Emergency Department Provider Note ____________________________________________   First MD Initiated Contact with Patient 06/16/19 [phone]     (approximate)  I have reviewed the triage vital signs and the nursing notes.   HISTORY  Chief Complaint Hip Pain    HPI Shaun Ward is a 44 y.o. male with PMH as noted below including alcohol abuse and emphysema who presents with a left hip injury after a fall this morning.  The patient states that he climbed up to try to turn off a ceiling fan and fell, hitting his left hip.  He states he was unable to ambulate and has had to crawl.  He denies head injury, LOC, or other injuries.  In addition, the patient reports that he previously was drinking 12-18 beers a day and quit on his own 7 days ago.  He reports that he has had tremors and hallucinations, although states that these symptoms were at their worst about 2 days ago and have now been improving.   Past Medical History:  Diagnosis Date  . Alcohol abuse   . Emphysema lung ([place])   . Polysubstance abuse ([place])   . Tobacco abuse     Patient Active Problem List   Diagnosis Date Noted  . Protein-calorie malnutrition, severe 05/05/2018  . Pressure injury of skin 05/04/2018  . Pneumonia 05/03/2018    Past Surgical History:  Procedure Laterality Date  . HERNIA REPAIR      Prior to Admission medications   Medication Sig Start Date End Date Taking? Authorizing Provider  diphenhydramine-acetaminophen (TYLENOL PM) 25-500 MG TABS tablet Take 1 tablet by mouth at bedtime as needed.   Yes [provider]    Allergies Patient has no known allergies.  Family History  Problem Relation Age of Onset  . Diabetes Father   . CAD Paternal Grandmother     Social History Social History   Tobacco Use  . Smoking status: Current Every Day Smoker    Packs/day: 1.00    Types: Cigarettes  . Smokeless tobacco: Never Used  Substance Use Topics  .  Alcohol use: Yes    Alcohol/week: 6.0 - 12.0 standard drinks    Types: 6 - 12 Cans of beer per week    Comment: Quit 7 days ago  . Drug use: Yes    Types: Marijuana    Review of Systems  Constitutional: No fever. Eyes: No redness. ENT: No sore throat. Cardiovascular: Denies chest pain. Respiratory: Denies shortness of breath. Gastrointestinal: No vomiting. Genitourinary: Negative for flank pain.  Musculoskeletal: Negative for back pain.  Positive for left hip pain. Skin: Negative for rash. Neurological: Negative for headache.   ____________________________________________   PHYSICAL EXAM:  VITAL SIGNS: ED Triage Vitals  Enc Vitals Group     BP 06/16/19 0540 (!) 138/105     Pulse Rate 06/16/19 0543 97     Resp 06/16/19 0540 19     Temp 06/16/19 0539 97.8 F (36.6 C)     Temp Source 06/16/19 0539 Oral     SpO2 --      Weight 06/16/19 0540 120 lb (54.4 kg)     Height 06/16/19 0540 5\' 7"  (1.702 m)     Head Circumference --      Peak Flow --      Pain Score 06/16/19 0540 3     Pain Loc --      Pain Edu? --      Excl. in [place]? --  Constitutional: Alert and oriented.  Frail appearing for age but in no acute distress. Eyes: Conjunctivae are normal.  EOMI.  PERRLA. Head: Atraumatic. Nose: No congestion/rhinnorhea. Mouth/Throat: Mucous membranes are dry.   Neck: Normal range of motion.  Cardiovascular: Normal rate, regular rhythm.  Good peripheral circulation. Respiratory: Normal respiratory effort.  No retractions.  Gastrointestinal: Soft and nontender. No distention.  Genitourinary: No flank tenderness. Musculoskeletal: No lower extremity edema.  Extremities warm and well perfused.  Tenderness to the left hip laterally.  No shortening of the left lower extremity.  Full range of motion to the left hip and knee.  2+ DP pulses bilaterally. Neurologic:  Normal speech and language.  Bilateral tremor and tongue fasciculation. Skin:  Skin is warm and dry. No rash  noted. Psychiatric: Mood and affect are normal. Speech and behavior are normal.  ____________________________________________   LABS (all labs ordered are listed, but only abnormal results are displayed)  Labs Reviewed  BASIC METABOLIC PANEL  CBC WITH DIFFERENTIAL/PLATELET  ETHANOL  URINALYSIS, COMPLETE (UACMP) WITH MICROSCOPIC   ____________________________________________  EKG  ED ECG REPORT I, Dionne Bucy, the attending physician, personally viewed and interpreted this ECG.  Date: 06/16/2019 EKG Time: 05 40 Rate: 94 Rhythm: normal sinus rhythm (incorrectly interpreted by machine as atrial fibrillation) QRS Axis: normal Intervals: normal ST/T Wave abnormalities: normal Narrative Interpretation: no evidence of acute ischemia  ____________________________________________  RADIOLOGY  XR L hip: Left pubic ramus fracture and possible sacral fracture CT pelvis: Findings compatible with LC 1 type lateral compression, with extraperitoneal hemorrhage  ____________________________________________   PROCEDURES  Procedure(s) performed: No  Procedures  Critical Care performed: No ____________________________________________   INITIAL IMPRESSION / ASSESSMENT AND PLAN / ED COURSE  Pertinent labs & imaging results that were available during my care of the patient were reviewed by me and considered in my medical decision making (see chart for details).  44 year old male with PMH as noted above presents with a left hip injury after a fall this morning.  The patient states that he has been unable to bear weight and has had to crawl, but EMS noted that he was ambulatory at home.  In addition, the patient quit drinking alcohol on his own 7 days ago and describes withdrawal symptoms including tremor and hallucinations.  She reports the worst of the symptoms about 2 to 3 days ago and has now been improving.  On exam, the patient is frail appearing and tremulous but  otherwise relatively comfortable.  He has no obvious deformity or shortening of the left hip but does have some tenderness laterally.  He has bilateral tremors and tongue fasciculations, however he is only mildly hypertensive and is not tachycardic.  Presentation is consistent with left hip contusion versus fracture.  We will obtain an x-ray and proceed to CT if the x-ray is inconclusive.  The patient does appear to be in alcohol withdrawal, although given that he has not had alcohol in 7 days and has reassuring vital signs, I suspect that he may be past the most severe withdrawal symptoms.  I will give fluids, IV Ativan, and reassess.  ----------------------------------------- 7:29 AM on 06/16/2019 -----------------------------------------  X-ray showed pelvic and possible sacral fractures, so I proceeded with a CT to further evaluate.  This reveals findings concerning for LC 1 type compression injury with extraperitoneal hemorrhage.  On repeat exam, the patient's vital signs remain stable.  He has no abdominal tenderness.  I discussed case with Dr. Allena Katz from orthopedics who is reviewing the images.  Plan will be likely to admit the patient here for further management.  I signed the patient out to the oncoming physician Dr. Scotty Court.  ____________________________________________   FINAL CLINICAL IMPRESSION(S) / ED DIAGNOSES  Final diagnoses:  Multiple closed stable lateral compression fractures of pelvis, initial encounter (HCC)  Alcohol withdrawal syndrome without complication (HCC)      NEW MEDICATIONS STARTED DURING THIS VISIT:  New Prescriptions   No medications on file     Note:  This document was prepared using Dragon voice recognition software and may include unintentional dictation errors.   Dionne Bucy, MD 06/16/19 0730

## 2019-06-16 NOTE — Progress Notes (Signed)
Rehab Admissions Coordinator Note:  Patient was screened by Clois Dupes for appropriateness for an Inpatient Acute Rehab admit at [facility]. Patient is observation status  with pelvic fracture with no need for surgery. Has good family support. He lacks the medical neccesity for an inpt hospital rehab admit.  At this time, we are recommending HH with family support when ready for d/c.  Clois Dupes RN MSN 06/16/2019, 9:50 PM  I can be reached at [phone].

## 2019-06-16 NOTE — ED Notes (Addendum)
Pt's mother updated about pt. Per mom, pt has been having severe hallucinations especially yesterday. Pt has been trying to quit drinking alcohol and quit on Saturday.   Mom: [phone]

## 2019-06-16 NOTE — H&P (Addendum)
History and Physical    Shaun Ward:[id number] DOB: 1975-04-29 DOA: 06/16/2019  Referring MD/NP/PA:   PCP: Patient, No Pcp Per   Patient coming from:  The patient is coming from home.  At baseline, pt is independent for most of ADL.        Chief Complaint: fall and left hip pain  HPI: Shaun Ward is a 44 y.o. male with medical history significant of polysubstance abuse, tobacco abuse, alcohol abuse, emphysema of lung, who presents with fall.  Pt states that he fell when he was climbing up to try to turn off a ceiling fan yesterday morning. No LOC. He injured his left hip, causing severe pain. The left hip pain is constant, sharp, severe, radiating down to the left leg. He states he was unable to ambulate. He denies head injury or neck injury.  No unilateral numbness or tingling his extremities for no facial droop or slurred speech.  Patient does not have chest pain.  Patient states that he has mild dry cough and mild shortness of breath due to smoking and emphysema of lung, which has not changed.  No fever or chills.  Patient has nausea and vomited once.  He had one loose stool bowel movement.  Currently no diarrhea or abdominal pain.  No symptoms of UTI. In addition, the patient reports that he previously was drinking 12-18 beers a day and quit on his own 7 days ago.  He reports that he has had tremors and hallucinations, although states that these symptoms were at their worst about 2 days ago and have now been improving.  ED Course: pt was found to have K3.1, WBC 4.3, Platelet 111, pending Covid PCR test, renal function okay, pending ethanol level, pending CK level, temperature normal, blood pressure 124/88, heart rate 85, RR 17, oxygen saturation 100% on room air. X-ray of left hip and CT-pelvis showed fractures of the L superior and inferior pubic rami and a L sacral ala fracture. Pt is placed on MedSurg bed for observation.  Orthopedic surgeon, Dr. Signa Kell was  consulted.  CT-pelvis: 1.Constellation of findings compatible with a lateral compression type 1 injury including comminuted displaced fractures of the superior ramus extending into the pubic body, inferior ramus, and a left sacral compression fracture.  2. Extraperitoneal hemorrhage, largely centered upon the superior ramus/pubic body fracture with hemorrhage tracking into the sub peritoneal space and space of Retzius as well as along the left pelvic sidewall. Fluid in hemorrhage displaces the bladder without convincing evidence of direct bladder injury.  3. Small amount of intramuscular hemorrhage within the left operator internus likely rising from the inferior ramus fracture.   Review of Systems:   General: no fevers, chills, no body weight gain, has fatigue HEENT: no blurry vision, hearing changes or sore throat Respiratory: no dyspnea, coughing, wheezing CV: no chest pain, no palpitations GI: has nausea, vomiting,  diarrhea, no constipation, abdominal pain, GU: no dysuria, burning on urination, increased urinary frequency, hematuria  Ext: no leg edema Neuro: no unilateral weakness, numbness, or tingling, no vision change or hearing loss.  Has fall, hallucination Skin: no rash, no skin tear. MSK: Has left hip pain  heme: No easy bruising.  Travel history: No recent long distant travel.  Allergy: No Known Allergies  Past Medical History:  Diagnosis Date  . Alcohol abuse   . Emphysema lung (HCC)   . Polysubstance abuse (HCC)   . Tobacco abuse     Past Surgical History:  Procedure  Laterality Date  . HERNIA REPAIR      Social History:  reports that he has been smoking cigarettes. He has been smoking about 1.00 pack per day. He has never used smokeless tobacco. He reports current alcohol use of about 6.0 - 12.0 standard drinks of alcohol per week. He reports current drug use. Drug: Marijuana.  Family History:  Family History  Problem Relation Age of Onset  .  Diabetes Father   . CAD Paternal Grandmother      Prior to Admission medications   Medication Sig Start Date End Date Taking? Authorizing Provider  diphenhydramine-acetaminophen (TYLENOL PM) 25-500 MG TABS tablet Take 1 tablet by mouth at bedtime as needed.   Yes [provider]    Physical Exam: Vitals:   06/16/19 0543 06/16/19 0630 06/16/19 0709 06/16/19 0804  BP: (!) 138/105 124/88 123/77 (!) 140/95  Pulse: 97 85 80 85  Resp:  17 17   Temp:      TempSrc:      SpO2:  100% 99%   Weight:      Height:       General: Not in acute distress.  Patient is tremulous HEENT:       Eyes: PERRL, EOMI, no scleral icterus.       ENT: No discharge from the ears and nose, no pharynx injection, no tonsillar enlargement.        Neck: No JVD, no bruit, no mass felt. Heme: No neck lymph node enlargement. Cardiac: S1/S2, RRR, No murmurs, No gallops or rubs. Respiratory:  No rales, wheezing, rhonchi or rubs. GI: Soft, nondistended, nontender, no rebound pain, no organomegaly, BS present. GU: No hematuria Ext: No pitting leg edema bilaterally. 2+DP/PT pulse bilaterally. Musculoskeletal: has tenderness in left hip Skin: No rashes.  Neuro: Alert, oriented X3, cranial nerves II-XII grossly intact, moves all extremities. Psych: Patient is not psychotic, no suicidal or hemocidal ideation.  Labs on Admission: I have personally reviewed following labs and imaging studies  CBC: Recent Labs  Lab 06/16/19 0545  WBC 4.3  NEUTROABS 2.5  HGB 11.5*  HCT 35.4*  MCV 96.2  PLT 111*   Basic Metabolic Panel: Recent Labs  Lab 06/16/19 0545  NA 135  K 3.1*  CL 99  CO2 24  GLUCOSE 106*  BUN 9  CREATININE 0.96  CALCIUM 8.3*   GFR: Estimated Creatinine Clearance: 76.3 mL/min (by C-G formula based on SCr of 0.96 mg/dL). Liver Function Tests: No results for input(s): AST, ALT, ALKPHOS, BILITOT, PROT, ALBUMIN in the last 168 hours. No results for input(s): LIPASE, AMYLASE in the last 168  hours. No results for input(s): AMMONIA in the last 168 hours. Coagulation Profile: No results for input(s): INR, PROTIME in the last 168 hours. Cardiac Enzymes: Recent Labs  Lab 06/16/19 0545  CKTOTAL 59   BNP (last 3 results) No results for input(s): PROBNP in the last 8760 hours. HbA1C: No results for input(s): HGBA1C in the last 72 hours. CBG: No results for input(s): GLUCAP in the last 168 hours. Lipid Profile: No results for input(s): CHOL, HDL, LDLCALC, TRIG, CHOLHDL, LDLDIRECT in the last 72 hours. Thyroid Function Tests: No results for input(s): TSH, T4TOTAL, FREET4, T3FREE, THYROIDAB in the last 72 hours. Anemia Panel: No results for input(s): VITAMINB12, FOLATE, FERRITIN, TIBC, IRON, RETICCTPCT in the last 72 hours. Urine analysis:    Component Value Date/Time   COLORURINE YELLOW (A) 06/16/2019 0737   APPEARANCEUR CLEAR (A) 06/16/2019 0737   APPEARANCEUR Clear 01/25/2014 1544  LABSPEC 1.009 06/16/2019 0737   LABSPEC 1.013 01/25/2014 1544   PHURINE 6.0 06/16/2019 [place] 06/16/2019 0737   GLUCOSEU Negative 01/25/2014 1544   HGBUR NEGATIVE 06/16/2019 [place] 06/16/2019 0737   BILIRUBINUR Negative 01/25/2014 1544   KETONESUR 5 (A) 06/16/2019 0737   PROTEINUR NEGATIVE 06/16/2019 0737   NITRITE NEGATIVE 06/16/2019 0737   LEUKOCYTESUR NEGATIVE 06/16/2019 0737   LEUKOCYTESUR Negative 01/25/2014 1544   Sepsis Labs: @LABRCNTIP (procalcitonin:4,lacticidven:4) )No results found for this or any previous visit (from the past 240 hour(s)).   Radiological Exams on Admission: CT PELVIS WO CONTRAST  Result Date: 06/16/2019 CLINICAL DATA:  Hip pain, ground level fall, abnormal radiograph EXAM: CT PELVIS WITHOUT CONTRAST TECHNIQUE: Multidetector CT imaging of the pelvis was performed following the standard protocol without intravenous contrast. COMPARISON:  Same day hip radiograph FINDINGS: Urinary Tract: Distal ureters are unremarkable.  No bladder wall thickening, calculi or debris. No convincing evidence of direct bladder injury, intraperitoneal or extraperitoneal rupture. Anterolateral bladder base is partially displaced by extraperitoneal hemorrhage arising from the left pelvic fractures. Bowel: Normal appendix. No large or small bowel wall thickening or dilatation. No evidence of obstruction. Vascular/Lymphatic: Evaluation of the vasculature limited in the absence of contrast media. Minimal atherosclerotic plaque within the iliac arteries and common femoral arteries bilaterally. Reproductive: Mild prostatomegaly and hypertrophic changes of the seminal vesicles. Other: Extraperitoneal hemorrhage appears centered upon the left pubic body and inferior rami fractures with small amount of adjacent intramuscular hemorrhage as well as hemorrhage in stranding in the space of Retzius partially displacing the left anterolateral bladder without convincing evidence of bladder injury. Musculoskeletal: There is a comminuted fracture involving the superior pubic ramus extending into the pubic root and symphysis pubis without abnormal diastatic symphyseal widening. There is a minimally displaced fracture of the left inferior pubic ramus with slight thickening and likely intramuscular hemorrhage of the adjacent operator internus. Furthermore, there is a ipsilateral left anterior sacral ala compression fracture. No abnormal widening of the SI joints. No right pelvic fractures are seen. Proximal femoral well seated and intact. IMPRESSION: Constellation of findings compatible with a lateral compression type 1 injury including comminuted displaced fractures of the superior ramus extending into the pubic body, inferior ramus, and a left sacral compression fracture. Extraperitoneal hemorrhage, largely centered upon the superior ramus/pubic body fracture with hemorrhage tracking into the sub peritoneal space and space of Retzius as well as along the left pelvic  sidewall. Fluid in hemorrhage displaces the bladder without convincing evidence of direct bladder injury. Small amount of intramuscular hemorrhage within the left operator internus likely rising from the inferior ramus fracture. Electronically Signed   By: Lovena Le M.D.   On: 06/16/2019 07:05   DG Hip Unilat W or Wo Pelvis 2-3 Views Left  Result Date: 06/16/2019 CLINICAL DATA:  Left hip pain after fall EXAM: DG HIP (WITH OR WITHOUT PELVIS) 2-3V LEFT COMPARISON:  None. FINDINGS: Mildly displaced fractures of the left superior pubic ramus extending into the pubic root and left inferior pubic ramus. The remaining bones of the pelvis are intact and congruent. No abnormal diastatic widening of the SI joints or symphysis pubis. Evaluation the sacrum is limited due to overlying bowel gas. Questionable discontinuity of the arcuate lines of the left sacral ala (see annotated image). Proximal femora are intact and normally located. Mild degenerative changes in the hips. IMPRESSION: Mildly displaced fractures of the left superior pubic ramus extending into the pubic root and left inferior  pubic ramus. Questionable fracture of the left sacral ala as well. Findings suspicious for an LC1 lateral compression injury. Consider further evaluation with CT imaging to further clarify the sacral fracture. These results were called by telephone at the time of interpretation on 06/16/2019 at 6:28 am to provider [facility] , who verbally acknowledged these results. Electronically Signed   By: Kreg Shropshire M.D.   On: 06/16/2019 06:31     EKG: Independently reviewed.  Sinus rhythm, QTC 481, low voltage, nonspecific T wave change  Assessment/Plan Principal Problem:   Fall at home, initial encounter Active Problems:   Pelvic fracture (HCC)   Tobacco abuse   Alcohol abuse   Polysubstance abuse (HCC)   Emphysema lung (HCC)   Fall   Hypokalemia   Thrombocytopenia (HCC)   Fall at home, initial encounter and pelvic  fracture: ortho is consulted. Per Dr. Allena Katz. No need for surgery and the patient can be WBAT on the LLE, [facility] approximately 2 weeks after discharge.   -place on med-surg bed for obs -will get CT-head -Prn Percocet, morphine for pain and Robaxin as needed for muscle spasm -PT/OT  Polysubstance abuse, Tobacco abuse and Alcohol abuse: Patient has alcohol withdrawal.  -Did counseling about importance of quitting substance -Nicotine patch -CIWA protocol -check UDS  Emphysema lung (HCC) -prn albuterol inhaler -prn mucinex for cough  Hypokalemia: K= 3.1 on admission. - Repleted - Check Mg level -->Mg 1.6 -->will give 2 grams of magnesium sulfate  Thrombocytopenia (HCC): Platelet 111.  May be related to alcohol abuse -check LDH -peripheral smear    DVT ppx: SCD Code Status: Full code Family Communication: not done, no family member is at bed side.       Disposition Plan:  Anticipate discharge back to previous home environment Consults called:  Ortho, Dr. Signa Kell Admission status: Med-surg bed for obs    Date of Service 06/16/2019    Lorretta Harp [facility]     If 7PM-7AM, please contact night-coverage [email or web address] 06/16/2019, 8:22 AM

## 2019-06-16 NOTE — ED Triage Notes (Signed)
Pt here via [facility] with complaints of left sided hip pain. Pt reports falling today. EMS states patient was ambulatory at his house upon arrival.   Pt tremoring on stretcher at this time, pt states he was drinking 12-18 beers a day but completely quit 7 days ago.

## 2019-06-16 NOTE — Evaluation (Signed)
Occupational Therapy Evaluation Patient Details Name: Shaun Ward MRN: [id number] DOB: 1975/12/25 Today's Date: 06/16/2019    History of Present Illness Pt is 44 yo male s/p fall at home, X-ray of left hip and CT-pelvis showed fractures of the L superior and inferior pubic rami and a L sacral ala fracture, and Extraperitoneal hemorrhage, largely centered upon the superior ramus/pubic body fracture with hemorrhage tracking into the sub peritoneal space and space of Retzius as well as along the left pelvic sidewall. Fluid in hemorrhage displaces the bladder, and small amount of intramuscular hemorrhage within the left operator internus likely rising from the inferior ramus fracture. per ortho consult pt can be WBAT on LLE. PMH of polysubstance abuse, etoh abuse, emphysema of lung.   Clinical Impression   Mr. Loudermilk was seen for OT evaluation this date. Prior to hospital admission, pt was active and independent. He endorses driving and engaging in community mobility without a device. Pt lives with his mother in a 1-level condo with a level entry. Currently pt demonstrates impairments as described below (See OT problem list) which functionally limit his ability to perform ADL/self-care tasks. Pt currently requires moderate assist for functional mobility as well as moderate assistance for LB ADL management in a seated position due to increased pain and decreased ROM in his LLE. Pt is eager to return to his PLOF with improved safety and functional independence and less pain. Pt would benefit from skilled OT to address noted impairments and functional limitations (see below for any additional details) in order to maximize safety and independence while minimizing falls risk and caregiver burden. Upon hospital discharge, recommend CIR for acute intensive inpatient rehabilitation services.      Follow Up Recommendations  CIR;Supervision/Assistance - 24 hour    Equipment Recommendations  3 in 1 bedside  commode    Recommendations for Other Services       Precautions / Restrictions Precautions Precautions: Fall;Other (comment) Precaution Comments: superior and inferior pubic rami fx (L), ala fx; extraperitoneal hemorrhage Restrictions Weight Bearing Restrictions: Yes LLE Weight Bearing: Weight bearing as tolerated      Mobility Bed Mobility Overal bed mobility: Needs Assistance Bed Mobility: Rolling;Supine to Sit;Sit to Sidelying Rolling: Min assist   Supine to sit: Mod assist;HOB elevated   Sit to sidelying: HOB elevated;Mod assist;+2 for safety/equipment General bed mobility comments: Pt unable to perform supine to sit due to elevated pain. instructed in log rolling technique, mod A to complete pt still complained of significant pain with movement  Transfers Overall transfer level: Needs assistance Equipment used: Rolling walker (2 wheeled) Transfers: Sit to/from Stand Sit to Stand: Min assist;From elevated surface;+2 safety/equipment;Mod assist         General transfer comment: minA to perform sit > stand; modA for stand >sit  with RW,  and min cueing for safety/sequencing.  Pt able to maintain static standing with minA    Balance Overall balance assessment: Needs assistance Sitting-balance support: Feet supported Sitting balance-Leahy Scale: Fair Sitting balance - Comments: Steady static sitting at EOB w/o UE support. Pt limited with dynamic sitting balance due to increased pain with weight shift.     Standing balance-Leahy Scale: Poor                             ADL either performed or assessed with clinical judgement   ADL Overall ADL's : Needs assistance/impaired  General ADL Comments: Pt functionally limited by pain and decreased AROM in BLE this date. He requires moderate assist for bed mobility and limited fxl mobility this date. He is noted to buckle when attempting to stand up from EOB and  requires singificant assist when lowering to sit into his room chair. Pt MOD A for all LB ADL management including bathing and dressing. Set-up/SUP for UB bathing, dressing, and self-feeding.     Vision         Perception     Praxis      Pertinent Vitals/Pain Pain Assessment: 0-10 Pain Score: 8  Pain Location: BLE, with pain worse LLE with mobility. Pt endorses pain as 4/10 when sitting Pain Descriptors / Indicators: Aching;Grimacing;Moaning Pain Intervention(s): Limited activity within patient's tolerance;Monitored during session;Premedicated before session;Repositioned;Utilized relaxation techniques     Hand Dominance     Extremity/Trunk Assessment Upper Extremity Assessment Upper Extremity Assessment: Overall WFL for tasks assessed(BUE notably tremulous t/o session, however pt able to use functionally. Strength, GMC/FMC WFLs t/o. Will continue to monitor.)   Lower Extremity Assessment Lower Extremity Assessment: Generalized weakness;Defer to PT evaluation RLE Deficits / Details: grossly 4+/5 LLE Deficits / Details: ankle DF/PF 4+5, knee flexion 4/5, knee extension 4-/5 and painful, hip flexion pain with very gentle pressure   Cervical / Trunk Assessment Cervical / Trunk Assessment: Normal   Communication Communication Communication: No difficulties   Cognition Arousal/Alertness: Awake/alert Behavior During Therapy: WFL for tasks assessed/performed Overall Cognitive Status: Within Functional Limits for tasks assessed                                 General Comments: oriented x4. Tremors noted, behavior WFLs. initially a bit lethargic, but with mobility awake/alert   General Comments  Pt tremulous t/o session. HR reaches 100-120 with mobility. Pain limited, but eager and motivaed t/o session.    Exercises Other Exercises Other Exercises: Pt educated in falls prevention strategies, safe use of AE for functional mobility, safe transfer technique, and  complementary alternative methods for pain management including deep breathing and distraction techniques.   Shoulder Instructions      Home Living Family/patient expects to be discharged to:: Private residence Living Arrangements: Parent;Children(Pt also states his 81 y.o. dtr stays with him on occasion.) Available Help at Discharge: Available 24 hours/day Type of Home: House Home Access: Level entry     Home Layout: One level     Bathroom Shower/Tub: Chief Strategy Officer: Standard     Home Equipment: None   Additional Comments: 3 falls this week, does endorse LE weakness      Prior Functioning/Environment Level of Independence: Independent        Comments: has not been working since [employer], does drive        OT Problem List: Decreased strength;Pain;Decreased coordination;Decreased activity tolerance;Decreased safety awareness;Impaired balance (sitting and/or standing);Decreased knowledge of use of DME or AE;Decreased range of motion      OT Treatment/Interventions: Self-care/ADL training;Therapeutic activities;Therapeutic exercise;DME and/or AE instruction;Patient/family education;Balance training    OT Goals(Current goals can be found in the care plan section) Acute Rehab OT Goals Patient Stated Goal: to decrease pain OT Goal Formulation: With patient Time For Goal Achievement: 06/30/19 Potential to Achieve Goals: Good ADL Goals Pt Will Perform Grooming: standing;with adaptive equipment;with supervision;with set-up(With LRAD PRN for improved safety and functional independence.) Pt Will Perform Lower Body Dressing: sit to/from stand;with adaptive equipment;with min  guard assist;with supervision;with set-up(With LRAD PRN for improved safety and functional independence.) Pt Will Transfer to Toilet: ambulating;with min guard assist;bedside commode(With LRAD PRN for improved safety and functional independence.) Pt Will Perform Toileting - Clothing  Manipulation and hygiene: sit to/from stand;with supervision;with set-up;with adaptive equipment(With LRAD PRN for improved safety and functional independence.)  OT Frequency: Min 3X/week   Barriers to D/C:            Co-evaluation   Reason for Co-Treatment: For patient/therapist safety;To address functional/ADL transfers;Necessary to address cognition/behavior during functional activity PT goals addressed during session: Mobility/safety with mobility;Balance;Proper use of DME OT goals addressed during session: ADL's and self-care;Proper use of Adaptive equipment and DME      AM-PAC OT "6 Clicks" Daily Activity     Outcome Measure Help from another person eating meals?: A Little Help from another person taking care of personal grooming?: A Little Help from another person toileting, which includes using toliet, bedpan, or urinal?: A Lot Help from another person bathing (including washing, rinsing, drying)?: A Lot Help from another person to put on and taking off regular upper body clothing?: A Little Help from another person to put on and taking off regular lower body clothing?: A Lot 6 Click Score: 15   End of Session Equipment Utilized During Treatment: Gait belt;Rolling walker Nurse Communication: Mobility status  Activity Tolerance: Patient tolerated treatment well Patient left: in chair;with call bell/phone within reach;with chair alarm set  OT Visit Diagnosis: Other abnormalities of gait and mobility (R26.89);History of falling (Z91.81);Pain Pain - Right/Left: Left(Both, worse in L) Pain - part of body: Hip                Time: 1455-1525 OT Time Calculation (min): 30 min Charges:  OT General Charges $OT Visit: 1 Visit OT Evaluation $OT Eval Moderate Complexity: 1 Mod OT Treatments $Self Care/Home Management : 8-22 mins  Shara Blazing, M.S., OTR/L Ascom: [phone] 06/16/19, 4:29 PM

## 2019-06-17 DIAGNOSIS — F10939 Alcohol use, unspecified with withdrawal, unspecified: Secondary | ICD-10-CM | POA: Diagnosis present

## 2019-06-17 DIAGNOSIS — F10239 Alcohol dependence with withdrawal, unspecified: Secondary | ICD-10-CM | POA: Diagnosis present

## 2019-06-17 DIAGNOSIS — S3289XA Fracture of other parts of pelvis, initial encounter for closed fracture: Secondary | ICD-10-CM

## 2019-06-17 LAB — CBC
HCT: 28.2 % — ABNORMAL LOW (ref 39.0–52.0)
Hemoglobin: 9 g/dL — ABNORMAL LOW (ref 13.0–17.0)
MCH: 31.6 pg (ref 26.0–34.0)
MCHC: 31.9 g/dL (ref 30.0–36.0)
MCV: 98.9 fL (ref 80.0–100.0)
Platelets: 86 10*3/uL — ABNORMAL LOW (ref 150–400)
RBC: 2.85 MIL/uL — ABNORMAL LOW (ref 4.22–5.81)
RDW: 15.9 % — ABNORMAL HIGH (ref 11.5–15.5)
WBC: 3.8 10*3/uL — ABNORMAL LOW (ref 4.0–10.5)
nRBC: 0 % (ref 0.0–0.2)

## 2019-06-17 LAB — BASIC METABOLIC PANEL
Anion gap: 6 (ref 5–15)
BUN: <5 mg/dL — ABNORMAL LOW (ref 6–20)
CO2: 22 mmol/L (ref 22–32)
Calcium: 7.7 mg/dL — ABNORMAL LOW (ref 8.9–10.3)
Chloride: 110 mmol/L (ref 98–111)
Creatinine, Ser: 0.43 mg/dL — ABNORMAL LOW (ref 0.61–1.24)
GFR calc Af Amer: >60 mL/min (ref >60)
GFR calc non Af Amer: >60 mL/min (ref >60)
Glucose, Bld: 78 mg/dL (ref 70–99)
Potassium: 3.4 mmol/L — ABNORMAL LOW (ref 3.5–5.1)
Sodium: 138 mmol/L (ref 135–145)

## 2019-06-17 LAB — MAGNESIUM: Magnesium: 2.2 mg/dL (ref 1.7–2.4)

## 2019-06-17 MED ORDER — ALBUTEROL SULFATE (2.5 MG/3ML) 0.083% IN NEBU: 2.5000 mg | INHALATION_SOLUTION | RESPIRATORY_TRACT | Status: DC | PRN

## 2019-06-17 MED ORDER — POTASSIUM CHLORIDE CRYS ER 20 MEQ PO TBCR
40.0000 meq | EXTENDED_RELEASE_TABLET | Freq: Once | ORAL | Status: AC
  Administered 2019-06-17: 40 meq via ORAL
  Filled 2019-06-17: qty 2

## 2019-06-17 MED ORDER — TRAZODONE HCL 50 MG PO TABS
50.0000 mg | ORAL_TABLET | Freq: Every evening | ORAL | Status: DC | PRN
  Administered 2019-06-17: 50 mg via ORAL
  Filled 2019-06-17: qty 1

## 2019-06-17 NOTE — Progress Notes (Signed)
Occupational Therapy Treatment Patient Details Name: Shaun Ward MRN: [id number] DOB: 1975/06/28 Today's Date: 06/17/2019    History of present illness Pt is 44 yo male s/p fall at home, X-ray of left hip and CT-pelvis showed fractures of the L superior and inferior pubic rami and a L sacral ala fracture, and Extraperitoneal hemorrhage, largely centered upon the superior ramus/pubic body fracture with hemorrhage tracking into the sub peritoneal space and space of Retzius as well as along the left pelvic sidewall. Fluid in hemorrhage displaces the bladder, and small amount of intramuscular hemorrhage within the left operator internus likely rising from the inferior ramus fracture. per ortho consult pt can be WBAT on LLE. PMH of polysubstance abuse, etoh abuse, emphysema of lung.   OT comments  [name] was seen for OT treatment on this date. Upon arrival to room pt awake long sitting in bed with father present in room. Pt A&O x 4 reporting 8/10 pain. Pt agreeable to bed level / educational tx session. RN administered pain medication during session. Pt instructed in falls prevention strategies, safe use of AE for LB access, incentive spirometer use/frequency, and complementary alternative methods for pain management including deep breathing and distraction techniques. Requiring SUP for repositioning torso/rear in bed using overhead trapeze and MOD A to reposition LLE. SUP + Setup for RLE access using reacher to doff R sock and adjust blankets. Pt verbalized understanding and returned demonstrated of instruction provided. Pt making good progress toward goals. Pt continues to benefit from skilled OT services to maximize return to PLOF and minimize risk of future falls, injury, caregiver burden, and readmission. Will continue to follow POC. Discharge recommendation updated to [facility] with 24 Hr supervision.    Follow Up Recommendations  Home health OT;Supervision/Assistance - 24 hour    Equipment  Recommendations  3 in 1 bedside commode    Recommendations for Other Services      Precautions / Restrictions Precautions Precautions: Fall Precaution Comments: superior and inferior pubic rami fx (L), ala fx; extraperitoneal hemorrhage Restrictions Weight Bearing Restrictions: Yes LLE Weight Bearing: Weight bearing as tolerated       Mobility Bed Mobility Overal bed mobility: Needs Assistance             General bed mobility comments: Pt able to Independently adjust rear in bed using overhead trapeze - MAX A for LLE management 2/2 pain  Transfers                 General transfer comment: Pt deferred OOB activity 2/2 pain in LLE 8/10    Balance Overall balance assessment: Needs assistance                                         ADL either performed or assessed with clinical judgement   ADL Overall ADL's : Needs assistance/impaired                                       General ADL Comments: Pain and decreased AROM in BLE limited LB access and functional mobility this session. Setup for self-feeding and grooming tasks seated in bed. SUP to adjust position c overhead trapeze as needed. Pt deferred LBD at EOB this session 2/2 8/10 pain in LLE. SUP doff R sock long sitting in bed c reacher.  Vision       Perception     Praxis      Cognition Arousal/Alertness: Awake/alert Behavior During Therapy: WFL for tasks assessed/performed Overall Cognitive Status: Within Functional Limits for tasks assessed                                 General Comments: Pt reported having multiple panic attacks t/o day 2/2 pain. OT addressed pain management techniques        Exercises Exercises: Other exercises Other Exercises Other Exercises: Pt educated re: falls prevention strategies, AE for functional mobility, importance of OOB t/o day, and complementary alternative methods for pain management including deep breathing  and distraction techniques. Other Exercises: Incentive spirometer (provided) use and frequency, AE for LBD, PLB for pain and anxiety management, self-feeding, overhead trapeze for bed mobility, plan for OOB to chair 1 meal/day   Shoulder Instructions       General Comments      Pertinent Vitals/ Pain       Pain Assessment: 0-10 Pain Score: 8  Pain Location: L hip/low back Pain Descriptors / Indicators: Aching;Grimacing;Guarding Pain Intervention(s): Limited activity within patient's tolerance;RN gave pain meds during session  Home Living                                          Prior Functioning/Environment              Frequency  Min 3X/week        Progress Toward Goals  OT Goals(current goals can now be found in the care plan section)  Progress towards OT goals: Progressing toward goals  Acute Rehab OT Goals Patient Stated Goal: to decrease pain OT Goal Formulation: With patient Time For Goal Achievement: 06/30/19 Potential to Achieve Goals: Good ADL Goals Pt Will Perform Grooming: standing;with adaptive equipment;with supervision;with set-up(With LRAD PRN for improved safety and functional independenc) Pt Will Perform Lower Body Dressing: sit to/from stand;with adaptive equipment;with min guard assist;with supervision;with set-up(With LRAD PRN for improved safety and functional independenc) Pt Will Transfer to Toilet: ambulating;with min guard assist;bedside commode(With LRAD PRN for improved safety and functional independenc) Pt Will Perform Toileting - Clothing Manipulation and hygiene: sit to/from stand;with supervision;with set-up;with adaptive equipment(With LRAD PRN for improved safety and functional independenc)  Plan Frequency remains appropriate;Discharge plan needs to be updated    Co-evaluation          OT goals addressed during session: ADL's and self-care;Proper use of Adaptive equipment and DME      AM-PAC OT "6 Clicks"  Daily Activity     Outcome Measure   Help from another person eating meals?: A Little Help from another person taking care of personal grooming?: A Little Help from another person toileting, which includes using toliet, bedpan, or urinal?: A Lot Help from another person bathing (including washing, rinsing, drying)?: A Lot Help from another person to put on and taking off regular upper body clothing?: A Little Help from another person to put on and taking off regular lower body clothing?: A Lot 6 Click Score: 15    End of Session    OT Visit Diagnosis: Other abnormalities of gait and mobility (R26.89);History of falling (Z91.81);Pain Pain - Right/Left: Left Pain - part of body: Hip   Activity Tolerance Patient limited by pain   Patient  Left with call bell/phone within reach;in bed;with bed alarm set;with family/visitor present(father in room)   Nurse Communication          Time: 3299-2426 OT Time Calculation (min): 24 min  Charges: OT General Charges $OT Visit: 1 Visit OT Treatments $Self Care/Home Management : 23-37 mins  Kathie Dike, M.S. OTR/L  06/17/19, 2:20 PM

## 2019-06-17 NOTE — TOC Initial Note (Signed)
Transition of Care [facility]) - Initial/Assessment Note    Patient Details  Name: Shaun Ward MRN: [id number] Date of Birth: 06-May-1975  [facility] CM/SW Contact:    Elease Hashimoto, LCSW Phone Number: 06/17/2019, 8:54 AM  Clinical Narrative:    Met with pt to discuss discharge plans. He plans to return home with Mom who is retired and can assist him. His 44 yo daughter stays with them at times. He also has a 103 yo son who lives with his Mom, but pt does see him. Pt reports he fell off his bed trying to adjust his ceiling fan. He did quit drinking 7 days ago and is still shaking from it but feels the worse part is over from this. He was hallucinating day 1-2 of quitting. He was independent prior to admission and was still driving. 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 He will ask MD if he needs to start disability applications. He does not have a PCP will get him [facility] information and assist with medications. Pt wants to be able to move around with a walker before DC due to his Mom can't physically assist him due to her own health issues. Pt reports he has not even sat up much in bed or gotten out of bed yet. Will work with and assist with discharge needs. Pt was very appreciative of any assist he can get.             Expected Discharge Plan: [place] Barriers to Discharge: Inadequate or no insurance, Continued Medical Work up   Patient Goals and CMS Choice Patient states their goals for this hospitalization and ongoing recovery are:: I hope to be in less pain and moving with a walker before I go home      Expected Discharge Plan and Services Expected Discharge Plan: [place] In-house Referral: Clinical Social Work   Post Acute Care Choice: Museum/gallery conservator, Home Health Living arrangements for the past 2 months: Single Family Home                                      Prior  Living Arrangements/Services Living arrangements for the past 2 months: Single Family Home Lives with:: Parents, Adult Children(22 yo daughter stays with sometimes)          Need for Family Participation in Patient Care: No (Comment) Care giver support system in place?: Yes (comment) Current home services: DME, Home PT, Home OT Criminal Activity/Legal Involvement Pertinent to Current Situation/Hospitalization: No - Comment as needed  Activities of Daily Living Home Assistive Devices/Equipment: None ADL Screening (condition at time of admission) Patient's cognitive ability adequate to safely complete daily activities?: Yes Is the patient deaf or have difficulty hearing?: No Does the patient have difficulty seeing, even when wearing glasses/contacts?: No Does the patient have difficulty concentrating, remembering, or making decisions?: No Patient able to express need for assistance with ADLs?: Yes Does the patient have difficulty dressing or bathing?: No Independently performs ADLs?: Yes (appropriate for developmental age) Does the patient have difficulty walking or climbing stairs?: No Weakness of Legs: Both Weakness of Arms/Hands: Both  Permission Sought/Granted                  Emotional Assessment Appearance:: Appears older than stated age Attitude/Demeanor/Rapport: Engaged, Gracious Affect (typically observed):  Accepting, Adaptable Orientation: : Oriented to Self, Oriented to Place, Oriented to  Time, Oriented to Situation Alcohol / Substance Use: Alcohol Use, Tobacco Use(Quit drinking 7 days ago) Psych Involvement: No (comment)  Admission diagnosis:  Fall [W19.XXXA] Alcohol withdrawal syndrome without complication ([place]) [T64.353] Multiple closed stable lateral compression fractures of pelvis, initial encounter [facility]) [S32.82XA] Patient Active Problem List   Diagnosis Date Noted  . Fall at home, initial encounter 06/16/2019  . Pelvic fracture ([place]) 06/16/2019  . Fall  06/16/2019  . Tobacco abuse   . Alcohol abuse   . Polysubstance abuse ([place])   . Emphysema lung ([place])   . Alcohol withdrawal syndrome without complication ([place])   . Hypokalemia   . Thrombocytopenia ([place])   . Protein-calorie malnutrition, severe 05/05/2018  . Pressure injury of skin 05/04/2018  . Pneumonia 05/03/2018   PCP:  Patient, No Pcp Per Pharmacy:   [id number] [id number]: [phone]: [phone] [id number]: [phone]: [phone]    Social Determinants of Health (SDOH) Interventions    Readmission Risk Interventions No flowsheet data found.

## 2019-06-17 NOTE — TOC Progression Note (Signed)
Transition of Care [facility]) - Progression Note    Patient Details  Name: MIRIAM KESTLER MRN: [id number] Date of Birth: 10-12-1975  Transition of Care [facility]) CM/SW Contact  Reyden Smith, Lemar Livings, LCSW Phone Number: 06/17/2019, 1:37 PM  Clinical Narrative:   Pt is unable to go SNF due to no payment source, he is uninsured. He can get home health and equipment but can not go to a SNF/Rehab. May need to have Mom come in to go through therapies to see what she can do to assist pt. CIR has denied admission. Will work on discharge needs.    Expected Discharge Plan: Home w Home Health Services Barriers to Discharge: Inadequate or no insurance, Continued Medical Work up  Expected Discharge Plan and Services Expected Discharge Plan: Home w Home Health Services In-house Referral: Clinical Social Work   Post Acute Care Choice: Horticulturist, commercial, Home Health Living arrangements for the past 2 months: Single Family Home                                       Social Determinants of Health (SDOH) Interventions    Readmission Risk Interventions No flowsheet data found.

## 2019-06-17 NOTE — Progress Notes (Signed)
PROGRESS NOTE    JOHNROBERT FOTI  PYK:[id number] DOB: 1976-02-09 DOA: 06/16/2019 PCP: Patient, No Pcp Per      Assessment & Plan:   Principal Problem:   Fall at home, initial encounter Active Problems:   Pelvic fracture (HCC)   Tobacco abuse   Alcohol abuse   Polysubstance abuse (HCC)   Emphysema lung (HCC)   Fall   Hypokalemia   Thrombocytopenia (HCC)  Pelvic fracture: secondary to fall at home. No surgery as per ortho surgery. Weight bearing as tolerated on LLE. Can f/u with [facility] approximately 2 weeks after discharge. Morphine, robaxin prn   Polysubstance abuse: w/ tobacco & alcohol abuse. Nicotine patch to prevent w/drawal. Smoking cessation counseling. Ativan prn for alcohol w/drawal. Alcohol abuse cessation counseling   Alcohol w/drawal: continue on CIWA protocol.  Alcohol abuse cessation counseling. Pt does want to quit drinking completely and pt's last drink was approx 7 days ago   Pancytopenia: likely secondary to alcohol abuse from bone marrow suppression. No need for transfusions at this time. LDH is WNL. Will continue to monitor   Emphysema lung: continue on albuterol HFA prn.   Hypokalemia: KCl repleated. Will continue to monitor     DVT prophylaxis: SCDs Code Status: full Family Communication:  Disposition Plan: will likely need several days more as pt is in alcohol w/drawal   Consultants:   Ortho surg   Procedures:   Antimicrobials:   Subjective: Pt c/o pelvic pain and tremors  Objective: Vitals:   06/16/19 0958 06/16/19 1306 06/16/19 2149 06/17/19 0022  BP: 113/80 102/79 109/75 104/85  Pulse:  87 79 79  Resp:  18 17 17   Temp:  98.6 F (37 C) 97.6 F (36.4 C) 98.3 F (36.8 C)  TempSrc:   Oral Oral  SpO2:  100% 97% 97%  Weight:      Height:        Intake/Output Summary (Last 24 hours) at 06/17/2019 0718 Last data filed at 06/16/2019 1800 Gross per 24 hour  Intake 1700 ml  Output --  Net 1700 ml   Filed  Weights   06/16/19 0540  Weight: 54.4 kg    Examination:  General exam: Appears uncomfortable. Appears older than stated age. Disheveled. Respiratory system: diminished breath sounds b/l. No rales  Cardiovascular system: S1 & S2 +. No rubs, gallops or clicks.  Gastrointestinal system: Abdomen is nondistended, soft and nontender. Normal bowel sounds heard. Central nervous system: Alert and oriented. Moves all 4 extremities  Psychiatry: Judgement and insight appear normal. Flat mood and affect     Data Reviewed: I have personally reviewed following labs and imaging studies  CBC: Recent Labs  Lab 06/16/19 0545 06/17/19 0413  WBC 4.3 3.8*  NEUTROABS 2.5  --   HGB 11.5* 9.0*  HCT 35.4* 28.2*  MCV 96.2 98.9  PLT 111* 86*   Basic Metabolic Panel: Recent Labs  Lab 06/16/19 0545 06/17/19 0413  NA 135 138  K 3.1* 3.4*  CL 99 110  CO2 24 22  GLUCOSE 106* 78  BUN 9 <5*  CREATININE 0.96 0.43*  CALCIUM 8.3* 7.7*  MG 1.6* 2.2   GFR: Estimated Creatinine Clearance: 91.6 mL/min (A) (by C-G formula based on SCr of 0.43 mg/dL (L)). Liver Function Tests: No results for input(s): AST, ALT, ALKPHOS, BILITOT, PROT, ALBUMIN in the last 168 hours. No results for input(s): LIPASE, AMYLASE in the last 168 hours. No results for input(s): AMMONIA in the last 168 hours. Coagulation Profile: No  results for input(s): INR, PROTIME in the last 168 hours. Cardiac Enzymes: Recent Labs  Lab 06/16/19 0545  CKTOTAL 59   BNP (last 3 results) No results for input(s): PROBNP in the last 8760 hours. HbA1C: No results for input(s): HGBA1C in the last 72 hours. CBG: No results for input(s): GLUCAP in the last 168 hours. Lipid Profile: No results for input(s): CHOL, HDL, LDLCALC, TRIG, CHOLHDL, LDLDIRECT in the last 72 hours. Thyroid Function Tests: No results for input(s): TSH, T4TOTAL, FREET4, T3FREE, THYROIDAB in the last 72 hours. Anemia Panel: No results for input(s): VITAMINB12, FOLATE,  FERRITIN, TIBC, IRON, RETICCTPCT in the last 72 hours. Sepsis Labs: No results for input(s): PROCALCITON, LATICACIDVEN in the last 168 hours.  Recent Results (from the past 240 hour(s))  SARS CORONAVIRUS 2 (TAT 6-24 HRS) Nasopharyngeal Nasopharyngeal Swab     Status: None   Collection Time: 06/16/19  8:23 AM   Specimen: Nasopharyngeal Swab  Result Value Ref Range Status   SARS Coronavirus 2 NEGATIVE NEGATIVE Final    Comment: (NOTE) SARS-CoV-2 target nucleic acids are NOT DETECTED. The SARS-CoV-2 RNA is generally detectable in upper and lower respiratory specimens during the acute phase of infection. Negative results do not preclude SARS-CoV-2 infection, do not rule out co-infections with other pathogens, and should not be used as the sole basis for treatment or other patient management decisions. Negative results must be combined with clinical observations, patient history, and epidemiological information. The expected result is Negative. Fact Sheet for Patients: HairSlick.no Fact Sheet for Healthcare Providers: quierodirigir.com This test is not yet approved or cleared by the [place] FDA and  has been authorized for detection and/or diagnosis of SARS-CoV-2 by FDA under an Emergency Use Authorization (EUA). This EUA will remain  in effect (meaning this test can be used) for the duration of the COVID-19 declaration under Section 56 4(b)(1) of the Act, 21 U.S.C. section 360bbb-3(b)(1), unless the authorization is terminated or revoked sooner. Performed at [facility], [address], [place], [place] [address]          Radiology Studies: CT HEAD WO CONTRAST  Result Date: 06/16/2019 CLINICAL DATA:  Headache.  Fall. EXAM: CT HEAD WITHOUT CONTRAST TECHNIQUE: Contiguous axial images were obtained from the base of the skull through the vertex without intravenous contrast. COMPARISON:  None. FINDINGS: Brain: There  is mild diffuse atrophy. There is no intracranial mass, hemorrhage, extra-axial fluid collection, or midline shift. Brain parenchyma appears unremarkable. No demonstrable acute infarct. Vascular: There is no hyperdense vessel. No appreciable vascular calcification. Skull: The bony calvarium appears intact. Sinuses/Orbits: There is opacification and mucosal thickening in several ethmoid air cells. There is also opacification in the posterior right sphenoid sinus. Orbits appear symmetric bilaterally. Other: Mastoid air cells are clear. IMPRESSION: Mild atrophy. No evident mass or hemorrhage. No findings suggesting acute infarct. Areas of paranasal sinus disease. Electronically Signed   By: Bretta Bang III M.D.   On: 06/16/2019 08:55   CT PELVIS WO CONTRAST  Result Date: 06/16/2019 CLINICAL DATA:  Hip pain, ground level fall, abnormal radiograph EXAM: CT PELVIS WITHOUT CONTRAST TECHNIQUE: Multidetector CT imaging of the pelvis was performed following the standard protocol without intravenous contrast. COMPARISON:  Same day hip radiograph FINDINGS: Urinary Tract: Distal ureters are unremarkable. No bladder wall thickening, calculi or debris. No convincing evidence of direct bladder injury, intraperitoneal or extraperitoneal rupture. Anterolateral bladder base is partially displaced by extraperitoneal hemorrhage arising from the left pelvic fractures. Bowel: Normal appendix. No  large or small bowel wall thickening or dilatation. No evidence of obstruction. Vascular/Lymphatic: Evaluation of the vasculature limited in the absence of contrast media. Minimal atherosclerotic plaque within the iliac arteries and common femoral arteries bilaterally. Reproductive: Mild prostatomegaly and hypertrophic changes of the seminal vesicles. Other: Extraperitoneal hemorrhage appears centered upon the left pubic body and inferior rami fractures with small amount of adjacent intramuscular hemorrhage as well as hemorrhage in  stranding in the space of Retzius partially displacing the left anterolateral bladder without convincing evidence of bladder injury. Musculoskeletal: There is a comminuted fracture involving the superior pubic ramus extending into the pubic root and symphysis pubis without abnormal diastatic symphyseal widening. There is a minimally displaced fracture of the left inferior pubic ramus with slight thickening and likely intramuscular hemorrhage of the adjacent operator internus. Furthermore, there is a ipsilateral left anterior sacral ala compression fracture. No abnormal widening of the SI joints. No right pelvic fractures are seen. Proximal femoral well seated and intact. IMPRESSION: Constellation of findings compatible with a lateral compression type 1 injury including comminuted displaced fractures of the superior ramus extending into the pubic body, inferior ramus, and a left sacral compression fracture. Extraperitoneal hemorrhage, largely centered upon the superior ramus/pubic body fracture with hemorrhage tracking into the sub peritoneal space and space of Retzius as well as along the left pelvic sidewall. Fluid in hemorrhage displaces the bladder without convincing evidence of direct bladder injury. Small amount of intramuscular hemorrhage within the left operator internus likely rising from the inferior ramus fracture. Electronically Signed   By: Kreg Shropshire M.D.   On: 06/16/2019 07:05   DG Hip Unilat W or Wo Pelvis 2-3 Views Left  Result Date: 06/16/2019 CLINICAL DATA:  Left hip pain after fall EXAM: DG HIP (WITH OR WITHOUT PELVIS) 2-3V LEFT COMPARISON:  None. FINDINGS: Mildly displaced fractures of the left superior pubic ramus extending into the pubic root and left inferior pubic ramus. The remaining bones of the pelvis are intact and congruent. No abnormal diastatic widening of the SI joints or symphysis pubis. Evaluation the sacrum is limited due to overlying bowel gas. Questionable discontinuity of  the arcuate lines of the left sacral ala (see annotated image). Proximal femora are intact and normally located. Mild degenerative changes in the hips. IMPRESSION: Mildly displaced fractures of the left superior pubic ramus extending into the pubic root and left inferior pubic ramus. Questionable fracture of the left sacral ala as well. Findings suspicious for an LC1 lateral compression injury. Consider further evaluation with CT imaging to further clarify the sacral fracture. These results were called by telephone at the time of interpretation on 06/16/2019 at 6:28 am to provider [facility] , who verbally acknowledged these results. Electronically Signed   By: Kreg Shropshire M.D.   On: 06/16/2019 06:31        Scheduled Meds: . dextromethorphan-guaiFENesin  1 tablet Oral BID  . folic acid  1 mg Oral Daily  . LORazepam  0-4 mg Intravenous Q6H   Followed by  . [START ON 06/18/2019] LORazepam  0-4 mg Intravenous Q12H  . multivitamin with minerals  1 tablet Oral Daily  . nicotine  21 mg Transdermal Daily  . pantoprazole  40 mg Oral Q1200  . thiamine  100 mg Oral Daily   Or  . thiamine  100 mg Intravenous Daily   Continuous Infusions: . sodium chloride 125 mL/hr at 06/16/19 2142     LOS: 0 days    Time spent: 35 mins  Wyvonnia Dusky, MD [facility] Pager [phone]  If 7PM-7AM, please contact night-coverage [email or web address] 06/17/2019, 7:18 AM

## 2019-06-17 NOTE — Progress Notes (Signed)
Physical Therapy Treatment Patient Details Name: Shaun Ward MRN: [id number] DOB: 07/31/1975 Today's Date: 06/17/2019    History of Present Illness Pt is 44 yo male s/p fall at home, X-ray of left hip and CT-pelvis showed fractures of the L superior and inferior pubic rami and a L sacral ala fracture, and Extraperitoneal hemorrhage, largely centered upon the superior ramus/pubic body fracture with hemorrhage tracking into the sub peritoneal space and space of Retzius as well as along the left pelvic sidewall. Fluid in hemorrhage displaces the bladder, and small amount of intramuscular hemorrhage within the left operator internus likely rising from the inferior ramus fracture. per ortho consult pt can be WBAT on LLE. PMH of polysubstance abuse, etoh abuse, emphysema of lung.    PT Comments    Pt was long sitting in bed upon arriving with father at bedside. He reported 6/10 pain at rest that quickly elevated to 10/10 with movements. Pt was premedicated prior to session. He was motivated and cooperative throughout. Tremors constant during session but does not increase. HR at rest 90 bpm that elevated to 130 bpm during ambulation. Quickly decreases with seated rest. Pt exited L side of bed with mod assist + increased time. Vcs throughout for technique and improved sequencing. He was able to stand and ambulate to doorway and return ~ 30 ft with mod/max assist. Heavily use of gait belt throughout to prevent LOB. Pt presents with very unstable/inconsistant gait pattern. Unable to get L foot flat on floor and tends to ambulate on toes. Knee buckling present during gait. Pt is high fall risk and therapist recommend +2 assist for safety with all mobility/transfers/gait. Lengthy discussion about D/C and equipment. PT recommends D/C to SNF to address deficits with all functional mobility and safety. Pt lives with mother and  currently would be unable to manage pt's needs (per pt's/pt's father). PT will continue to  follow per POC and progress as able per pt tolerance/abilities allow.       Follow Up Recommendations  SNF     Equipment Recommendations  Rolling walker with 5" wheels    Recommendations for Other Services       Precautions / Restrictions Precautions Precautions: Fall;Other (comment) Precaution Comments: superior and inferior pubic rami fx (L), ala fx; extraperitoneal hemorrhage Restrictions Weight Bearing Restrictions: Yes LLE Weight Bearing: Weight bearing as tolerated    Mobility  Bed Mobility Overal bed mobility: Needs Assistance Bed Mobility: Rolling;Supine to Sit;Sit to Sidelying Rolling: Min assist   Supine to sit: Mod assist;HOB elevated   Sit to sidelying: Mod assist;HOB elevated General bed mobility comments: Pt was able to exit L side of bed with increased time and vcs. MOD assist both to exit bed and re-entry  Transfers Overall transfer level: Needs assistance Equipment used: Rolling walker (2 wheeled) Transfers: Sit to/from Stand Sit to Stand: Mod assist;From elevated surface         General transfer comment: Pt was able to stand from Elevated EOB with vcs for handplacement, fwd wt shift, and MOD assist to achieve standing. Heavy use of  UE 2/2 to pain in L hip. pt reports 10/10 pain but has had max amount of pain meds already given.  Ambulation/Gait Ambulation/Gait assistance: Mod assist;Max assist Gait Distance (Feet): 30 Feet Assistive device: Rolling walker (2 wheeled) Gait Pattern/deviations: Step-to pattern;Ataxic;Trunk flexed;Decreased stance time - left Gait velocity: decreased   General Gait Details: Pt was able to ambulate from EOB to doorway and return to bed. ~ 30 ft  with RW + mod assist that progressed to max of one for safety. Pt has poor posture throughout gait and is unable to place L foot flat on floor. Tremors noted throughout. Very fatigued after ambulation. HR elevated from 99bpm to 140bpm   Stairs             Wheelchair  Mobility    Modified Rankin (Stroke Patients Only)       Balance Overall balance assessment: Needs assistance Sitting-balance support: Feet supported Sitting balance-Leahy Scale: Fair Sitting balance - Comments: No LOB seated EOB with reaching outside BOS for drink. Tremors present throughout.   Standing balance support: Bilateral upper extremity supported Standing balance-Leahy Scale: Poor Standing balance comment: Heavily relys on BUE support for balance. mod/max support throughout all standing activity to prevent fall.                            Cognition Arousal/Alertness: Awake/alert Behavior During Therapy: WFL for tasks assessed/performed Overall Cognitive Status: Within Functional Limits for tasks assessed                                 General Comments: oriented x4. Tremors noted, behavior WFLs. agrees to PT session and is cooperative      Exercises      General Comments        Pertinent Vitals/Pain Pain Assessment: 0-10 Pain Score: 10-Worst pain ever Pain Location: L hip/low back Pain Descriptors / Indicators: Aching;Grimacing;Moaning Pain Intervention(s): Limited activity within patient's tolerance;Monitored during session    Home Living                      Prior Function            PT Goals (current goals can now be found in the care plan section) Acute Rehab PT Goals Patient Stated Goal: to decrease pain Progress towards PT goals: Progressing toward goals    Frequency    7X/week      PT Plan Discharge plan needs to be updated    Co-evaluation              AM-PAC PT "6 Clicks" Mobility   Outcome Measure  Help needed turning from your back to your side while in a flat bed without using bedrails?: A Lot Help needed moving from lying on your back to sitting on the side of a flat bed without using bedrails?: A Lot Help needed moving to and from a bed to a chair (including a wheelchair)?: A Lot Help  needed standing up from a chair using your arms (e.g., wheelchair or bedside chair)?: A Lot Help needed to walk in hospital room?: A Lot Help needed climbing 3-5 steps with a railing? : Total 6 Click Score: 11    End of Session Equipment Utilized During Treatment: Gait belt Activity Tolerance: Patient limited by pain Patient left: in bed;with call bell/phone within reach;with bed alarm set;with family/visitor present Nurse Communication: Mobility status PT Visit Diagnosis: Muscle weakness (generalized) (M62.81);History of falling (Z91.81);Difficulty in walking, not elsewhere classified (R26.2);Pain;Other abnormalities of gait and mobility (R26.89) Pain - Right/Left: Left Pain - part of body: Hip     Time: 1308-6578 PT Time Calculation (min) (ACUTE ONLY): 16 min  Charges:  $Gait Training: 8-22 mins  Jetta Lout PTA 06/17/19, 11:53 AM

## 2019-06-18 LAB — CBC
HCT: 27.9 % — ABNORMAL LOW (ref 39.0–52.0)
Hemoglobin: 9 g/dL — ABNORMAL LOW (ref 13.0–17.0)
MCH: 31.9 pg (ref 26.0–34.0)
MCHC: 32.3 g/dL (ref 30.0–36.0)
MCV: 98.9 fL (ref 80.0–100.0)
Platelets: 106 10*3/uL — ABNORMAL LOW (ref 150–400)
RBC: 2.82 MIL/uL — ABNORMAL LOW (ref 4.22–5.81)
RDW: 15.5 % (ref 11.5–15.5)
WBC: 3.2 10*3/uL — ABNORMAL LOW (ref 4.0–10.5)
nRBC: 0 % (ref 0.0–0.2)

## 2019-06-18 LAB — COMPREHENSIVE METABOLIC PANEL
ALT: 20 U/L (ref 0–44)
AST: 18 U/L (ref 15–41)
Albumin: 2.9 g/dL — ABNORMAL LOW (ref 3.5–5.0)
Alkaline Phosphatase: 56 U/L (ref 38–126)
Anion gap: 7 (ref 5–15)
BUN: <5 mg/dL — ABNORMAL LOW (ref 6–20)
CO2: 22 mmol/L (ref 22–32)
Calcium: 7.9 mg/dL — ABNORMAL LOW (ref 8.9–10.3)
Chloride: 110 mmol/L (ref 98–111)
Creatinine, Ser: 0.6 mg/dL — ABNORMAL LOW (ref 0.61–1.24)
GFR calc Af Amer: >60 mL/min (ref >60)
GFR calc non Af Amer: >60 mL/min (ref >60)
Glucose, Bld: 96 mg/dL (ref 70–99)
Potassium: 4.1 mmol/L (ref 3.5–5.1)
Sodium: 139 mmol/L (ref 135–145)
Total Bilirubin: 1.4 mg/dL — ABNORMAL HIGH (ref 0.3–1.2)
Total Protein: 5.2 g/dL — ABNORMAL LOW (ref 6.5–8.1)

## 2019-06-18 NOTE — Progress Notes (Signed)
Physical Therapy Treatment Patient Details Name: Shaun Ward MRN: [id number] DOB: [birth year] Today's Date: 06/18/2019    History of Present Illness Pt is 44 yo male s/p fall at home, X-ray of left hip and CT-pelvis showed fractures of the L superior and inferior pubic rami and a L sacral ala fracture, and Extraperitoneal hemorrhage, largely centered upon the superior ramus/pubic body fracture with hemorrhage tracking into the sub peritoneal space and space of Retzius as well as along the left pelvic sidewall. Fluid in hemorrhage displaces the bladder, and small amount of intramuscular hemorrhage within the left operator internus likely rising from the inferior ramus fracture. per ortho consult pt can be WBAT on LLE. PMH of polysubstance abuse, etoh abuse, emphysema of lung.    PT Comments    Pt was long sitting in bed with father at bedside upon arriving. Continues to present with tremors throughout session. At rest, HR 90 bpm and reports no pain. With movements and wt bearing, pain increased to 6/10. Pt is A and O x 4 and is pleasant and motivated. Agrees to OOB activity but requested to return to bed after session. He was able to exit L side of bed with increased time and Min A. With returning to supine after gait training, required only CGA + additional time. Pt was able to tolerated standing from slightly elevated bed height to RW with min assist. He ambulated ~ 50 ft in room with RW + gait belt and min A.Pt did not want to ambulate into hallway. Much improved safety with ambulation today however continues to have poor gait kinematics. He tends to ambulate on L toes and flexed knee/posture. Heavily UE support throughout to offload wt LLE. Ambulated with increased cadence today and required cueing to slow down for safety. HR elevated to 130s during ambulation but with seated rest decreased to 105 bpm after only 30 sec rest. Pt tolerated session well. Lengthy discussion about d/c disposition after  hospital stay. Per CM notes, unable to d/c to SNF. Therapist issued gait belt for home use when D/C'd. Therapist discussed barriers pt might have with going home. He plans to D/C to mothers who does not have stairs. Pt will need RW for safety and HHPT if able to receive. Both pt and father understand not being able to d/c to rehab facility. Overall pt is progressing well with PT and will continue to be followed per POC. Therapist discussed with RN tech recommendation to use [facility] when pt request to use BR and have +2 assist for safety if they were to ambulate.     Follow Up Recommendations  SNF     Equipment Recommendations  Rolling walker with 5" wheels    Recommendations for Other Services       Precautions / Restrictions Precautions Precautions: Fall Precaution Comments: superior and inferior pubic rami fx (L), ala fx; extraperitoneal hemorrhage Restrictions Weight Bearing Restrictions: Yes LLE Weight Bearing: Weight bearing as tolerated    Mobility  Bed Mobility Overal bed mobility: Needs Assistance Bed Mobility: Supine to Sit;Sit to Supine     Supine to sit: Min assist;HOB elevated Sit to supine: Min guard;HOB elevated   General bed mobility comments: Pt required increased time to perform however less assistance today. He exited L side of bed. progressed BLEs off EOB but did require Min assist (HHA)  for trunk control. no physical assistance returning to supine from seated EOB but CGA for safety.  Transfers Overall transfer level: Needs assistance Equipment used: Rolling  walker (2 wheeled) Transfers: Sit to/from Stand Sit to Stand: Min assist;From elevated surface         General transfer comment: Pt was able to stand from slightly elevated bed height with min assist + gait belt/RW. Vcs for improved technique. LLE wt bearing limited 2/2 to pain but was able to tolerate increased wt versus previous date.  Ambulation/Gait Ambulation/Gait assistance: Min assist Gait  Distance (Feet): 50 Feet Assistive device: Rolling walker (2 wheeled) Gait Pattern/deviations: Step-to pattern;Ataxic;Trunk flexed;Decreased stance time - left     General Gait Details: Pt was able to ambulate in his room ~ 50 ft with RW/gait belt and min assist for safety. Pt tends to ambulate on L toes 2/2 to pain but is able to static stand with foot flat. Much improved gait safety versus previous date. Vcs to slow down for safety. UNwilling to ambulate in halls. HR did elevated to 130s but with seated rest recovers to 105bpm.    Stairs             Wheelchair Mobility    Modified Rankin (Stroke Patients Only)       Balance Overall balance assessment: Needs assistance Sitting-balance support: Feet supported Sitting balance-Leahy Scale: Fair Sitting balance - Comments: No LOB seated EOB with reaching outside BOS for drink. Tremors present throughout.   Standing balance support: Bilateral upper extremity supported Standing balance-Leahy Scale: Fair Standing balance comment: heavily relys on UEs for support 2/2 to pain however no LOB noted.                            Cognition Arousal/Alertness: Awake/alert Behavior During Therapy: WFL for tasks assessed/performed Overall Cognitive Status: Within Functional Limits for tasks assessed                                 General Comments: Pt is A and O x 4. Very cooperative and pleasant throughout. No issues following commands. Slightly impulsive but aware of safety deficits.      Exercises      General Comments        Pertinent Vitals/Pain Pain Assessment: 0-10 Pain Score: 6  Pain Location: L hip/low back Pain Descriptors / Indicators: Aching;Grimacing;Guarding Pain Intervention(s): Limited activity within patient's tolerance;Monitored during session    Home Living                      Prior Function            PT Goals (current goals can now be found in the care plan section)  Acute Rehab PT Goals Patient Stated Goal: To decrease L hip pain and walk better Progress towards PT goals: Progressing toward goals    Frequency    7X/week      PT Plan Current plan remains appropriate    Co-evaluation     PT goals addressed during session: Mobility/safety with mobility        AM-PAC PT "6 Clicks" Mobility   Outcome Measure  Help needed turning from your back to your side while in a flat bed without using bedrails?: A Little Help needed moving from lying on your back to sitting on the side of a flat bed without using bedrails?: A Little Help needed moving to and from a bed to a chair (including a wheelchair)?: A Lot Help needed standing up from a chair using your  arms (e.g., wheelchair or bedside chair)?: A Lot Help needed to walk in hospital room?: A Lot Help needed climbing 3-5 steps with a railing? : A Lot 6 Click Score: 14    End of Session Equipment Utilized During Treatment: Gait belt Activity Tolerance: Patient tolerated treatment well Patient left: in bed;with call bell/phone within reach;with bed alarm set;with family/visitor present Nurse Communication: Mobility status PT Visit Diagnosis: Muscle weakness (generalized) (M62.81);History of falling (Z91.81);Difficulty in walking, not elsewhere classified (R26.2);Pain;Other abnormalities of gait and mobility (R26.89) Pain - Right/Left: Left Pain - part of body: Hip     Time: 1040-1107 PT Time Calculation (min) (ACUTE ONLY): 27 min  Charges:  $Gait Training: 8-22 mins $Therapeutic Activity: 8-22 mins                     Jetta Lout PTA 06/18/19, 11:54 AM

## 2019-06-18 NOTE — Progress Notes (Signed)
PROGRESS NOTE    Shaun Ward  UKG:[id number] DOB: 24-Aug-1975 DOA: 06/16/2019 PCP: Patient, No Pcp Per      Assessment & Plan:   Principal Problem:   Fall at home, initial encounter Active Problems:   Pelvic fracture (HCC)   Tobacco abuse   Alcohol abuse   Polysubstance abuse (HCC)   Emphysema lung (HCC)   Fall   Hypokalemia   Thrombocytopenia (HCC)   Alcohol withdrawal (HCC)  Pelvic fracture: secondary to fall at home. No surgery as per ortho surgery. Weight bearing as tolerated on LLE. Can f/u with [facility] approximately 2 weeks after discharge. Morphine, robaxin prn. PT recs SNF & OT recs home health. Will discuss w/ CM  Polysubstance abuse: w/ tobacco & alcohol abuse. Nicotine patch to prevent w/drawal. Smoking cessation counseling. Ativan prn for alcohol w/drawal. Alcohol abuse cessation counseling   Alcohol w/drawal: continue on CIWA protocol.  Alcohol abuse cessation counseling. Pt does want to quit drinking completely and pt's last drink was approx 7 days ago   Pancytopenia: likely secondary to alcohol abuse from bone marrow suppression. No need for transfusions currently. LDH is WNL. Will continue to monitor   Hyperbilirubinemia: etiology unclear, possibly secondary to alcohol abuse. Will continue to monitor   Emphysema lung: continue on albuterol HFA prn.   Hypokalemia: WNL today. Will continue to monitor     DVT prophylaxis: SCDs Code Status: full Family Communication:  Disposition Plan: will likely need couple days more as pt is in alcohol w/drawal   Consultants:   Ortho surg   Procedures:   Antimicrobials:   Subjective: Pt c/o still of pelvic pain and tremors  Objective: Vitals:   06/17/19 0022 06/17/19 0813 06/17/19 1612 06/18/19 0030  BP: 104/85 118/82 126/84 120/82  Pulse: 79 (!) 101 72 69  Resp: 17 16 15 16   Temp: 98.3 F (36.8 C) 98.4 F (36.9 C) 98.2 F (36.8 C) 98.2 F (36.8 C)  TempSrc: Oral   Oral    SpO2: 97% 97% 100% 98%  Weight:      Height:        Intake/Output Summary (Last 24 hours) at 06/18/2019 0719 Last data filed at 06/18/2019 0502 Gross per 24 hour  Intake 960 ml  Output 2425 ml  Net -1465 ml   Filed Weights   06/16/19 0540  Weight: 54.4 kg    Examination:  General exam: Appears uncomfortable. Appears older than stated age. Disheveled. Respiratory system: decreased breath sounds b/l. No wheezes  Cardiovascular system: S1 & S2 +. No rubs, gallops or clicks.  Gastrointestinal system: Abdomen is nondistended, soft and nontender. Hypoactive bowel sounds heard. Central nervous system: Alert and oriented. Moves all 4 extremities  Psychiatry: Judgement and insight appear normal. Flat mood and affect     Data Reviewed: I have personally reviewed following labs and imaging studies  CBC: Recent Labs  Lab 06/16/19 0545 06/17/19 0413 06/18/19 0534  WBC 4.3 3.8* 3.2*  NEUTROABS 2.5  --   --   HGB 11.5* 9.0* 9.0*  HCT 35.4* 28.2* 27.9*  MCV 96.2 98.9 98.9  PLT 111* 86* 106*   Basic Metabolic Panel: Recent Labs  Lab 06/16/19 0545 06/17/19 0413 06/18/19 0534  NA 135 138 139  K 3.1* 3.4* 4.1  CL 99 110 110  CO2 24 22 22   GLUCOSE 106* 78 96  BUN 9 <5* <5*  CREATININE 0.96 0.43* 0.60*  CALCIUM 8.3* 7.7* 7.9*  MG 1.6* 2.2  --  GFR: Estimated Creatinine Clearance: 91.6 mL/min (A) (by C-G formula based on SCr of 0.6 mg/dL (L)). Liver Function Tests: Recent Labs  Lab 06/18/19 0534  AST 18  ALT 20  ALKPHOS 56  BILITOT 1.4*  PROT 5.2*  ALBUMIN 2.9*   No results for input(s): LIPASE, AMYLASE in the last 168 hours. No results for input(s): AMMONIA in the last 168 hours. Coagulation Profile: No results for input(s): INR, PROTIME in the last 168 hours. Cardiac Enzymes: Recent Labs  Lab 06/16/19 0545  CKTOTAL 59   BNP (last 3 results) No results for input(s): PROBNP in the last 8760 hours. HbA1C: No results for input(s): HGBA1C in the last 72  hours. CBG: No results for input(s): GLUCAP in the last 168 hours. Lipid Profile: No results for input(s): CHOL, HDL, LDLCALC, TRIG, CHOLHDL, LDLDIRECT in the last 72 hours. Thyroid Function Tests: No results for input(s): TSH, T4TOTAL, FREET4, T3FREE, THYROIDAB in the last 72 hours. Anemia Panel: No results for input(s): VITAMINB12, FOLATE, FERRITIN, TIBC, IRON, RETICCTPCT in the last 72 hours. Sepsis Labs: No results for input(s): PROCALCITON, LATICACIDVEN in the last 168 hours.  Recent Results (from the past 240 hour(s))  SARS CORONAVIRUS 2 (TAT 6-24 HRS) Nasopharyngeal Nasopharyngeal Swab     Status: None   Collection Time: 06/16/19  8:23 AM   Specimen: Nasopharyngeal Swab  Result Value Ref Range Status   SARS Coronavirus 2 NEGATIVE NEGATIVE Final    Comment: (NOTE) SARS-CoV-2 target nucleic acids are NOT DETECTED. The SARS-CoV-2 RNA is generally detectable in upper and lower respiratory specimens during the acute phase of infection. Negative results do not preclude SARS-CoV-2 infection, do not rule out co-infections with other pathogens, and should not be used as the sole basis for treatment or other patient management decisions. Negative results must be combined with clinical observations, patient history, and epidemiological information. The expected result is Negative. Fact Sheet for Patients: SugarRoll.be Fact Sheet for Healthcare Providers: https://www.woods-mathews.com/ This test is not yet approved or cleared by the [place] FDA and  has been authorized for detection and/or diagnosis of SARS-CoV-2 by FDA under an Emergency Use Authorization (EUA). This EUA will remain  in effect (meaning this test can be used) for the duration of the COVID-19 declaration under Section 56 4(b)(1) of the Act, 21 U.S.C. section 360bbb-3(b)(1), unless the authorization is terminated or revoked sooner. Performed at [facility],  [place] [address] [place], [place] [address]          Radiology Studies: CT HEAD WO CONTRAST  Result Date: 06/16/2019 CLINICAL DATA:  Headache.  Fall. EXAM: CT HEAD WITHOUT CONTRAST TECHNIQUE: Contiguous axial images were obtained from the base of the skull through the vertex without intravenous contrast. COMPARISON:  None. FINDINGS: Brain: There is mild diffuse atrophy. There is no intracranial mass, hemorrhage, extra-axial fluid collection, or midline shift. Brain parenchyma appears unremarkable. No demonstrable acute infarct. Vascular: There is no hyperdense vessel. No appreciable vascular calcification. Skull: The bony calvarium appears intact. Sinuses/Orbits: There is opacification and mucosal thickening in several ethmoid air cells. There is also opacification in the posterior right sphenoid sinus. Orbits appear symmetric bilaterally. Other: Mastoid air cells are clear. IMPRESSION: Mild atrophy. No evident mass or hemorrhage. No findings suggesting acute infarct. Areas of paranasal sinus disease. Electronically Signed   By: Lowella Grip III M.D.   On: 06/16/2019 08:55        Scheduled Meds: . dextromethorphan-guaiFENesin  1 tablet Oral BID  . folic acid  1 mg Oral Daily  . LORazepam  0-4 mg Intravenous Q6H   Followed by  . LORazepam  0-4 mg Intravenous Q12H  . multivitamin with minerals  1 tablet Oral Daily  . nicotine  21 mg Transdermal Daily  . pantoprazole  40 mg Oral Q1200  . thiamine  100 mg Oral Daily   Or  . thiamine  100 mg Intravenous Daily   Continuous Infusions: . sodium chloride 125 mL/hr at 06/17/19 0734     LOS: 1 day    Time spent: 32 mins     Charise Killian, MD [facility] Pager [phone]  If 7PM-7AM, please contact night-coverage [email or web address] 06/18/2019, 7:19 AM

## 2019-06-19 LAB — COMPREHENSIVE METABOLIC PANEL
ALT: 16 U/L (ref 0–44)
AST: 14 U/L — ABNORMAL LOW (ref 15–41)
Albumin: 2.8 g/dL — ABNORMAL LOW (ref 3.5–5.0)
Alkaline Phosphatase: 54 U/L (ref 38–126)
Anion gap: 6 (ref 5–15)
BUN: <5 mg/dL — ABNORMAL LOW (ref 6–20)
CO2: 25 mmol/L (ref 22–32)
Calcium: 8.3 mg/dL — ABNORMAL LOW (ref 8.9–10.3)
Chloride: 106 mmol/L (ref 98–111)
Creatinine, Ser: 0.51 mg/dL — ABNORMAL LOW (ref 0.61–1.24)
GFR calc Af Amer: >60 mL/min (ref >60)
GFR calc non Af Amer: >60 mL/min (ref >60)
Glucose, Bld: 95 mg/dL (ref 70–99)
Potassium: 3.5 mmol/L (ref 3.5–5.1)
Sodium: 137 mmol/L (ref 135–145)
Total Bilirubin: 0.9 mg/dL (ref 0.3–1.2)
Total Protein: 5.1 g/dL — ABNORMAL LOW (ref 6.5–8.1)

## 2019-06-19 LAB — CBC
HCT: 27.1 % — ABNORMAL LOW (ref 39.0–52.0)
Hemoglobin: 8.6 g/dL — ABNORMAL LOW (ref 13.0–17.0)
MCH: 31.5 pg (ref 26.0–34.0)
MCHC: 31.7 g/dL (ref 30.0–36.0)
MCV: 99.3 fL (ref 80.0–100.0)
Platelets: 161 10*3/uL (ref 150–400)
RBC: 2.73 MIL/uL — ABNORMAL LOW (ref 4.22–5.81)
RDW: 15.4 % (ref 11.5–15.5)
WBC: 3.5 10*3/uL — ABNORMAL LOW (ref 4.0–10.5)
nRBC: 0 % (ref 0.0–0.2)

## 2019-06-19 MED ORDER — ENOXAPARIN SODIUM 30 MG/0.3ML ~~LOC~~ SOLN
30.0000 mg | SUBCUTANEOUS | Status: DC
  Administered 2019-06-19: 13:00:00 30 mg via SUBCUTANEOUS
  Filled 2019-06-19: qty 0.3

## 2019-06-19 MED ORDER — LORAZEPAM 2 MG/ML IJ SOLN: 1.0000 mg | INTRAMUSCULAR | Status: DC | PRN

## 2019-06-19 MED ORDER — LORAZEPAM 1 MG PO TABS: 1.0000 mg | ORAL_TABLET | ORAL | Status: DC | PRN

## 2019-06-19 NOTE — Progress Notes (Signed)
PROGRESS NOTE    Shaun Ward  EHM:[id number] DOB: 1976/04/04 DOA: 06/16/2019 PCP: Patient, No Pcp Per      Assessment & Plan:   Principal Problem:   Fall at home, initial encounter Active Problems:   Pelvic fracture (HCC)   Tobacco abuse   Alcohol abuse   Polysubstance abuse (HCC)   Emphysema lung (HCC)   Fall   Hypokalemia   Thrombocytopenia (HCC)   Alcohol withdrawal (HCC)  Pelvic fracture: secondary to fall at home. No surgery as per ortho surgery. Weight bearing as tolerated on LLE. Can f/u with [facility] approximately 2 weeks after discharge. Morphine, percocet, robaxin prn. PT recs SNF & OT recs home health.Sent message to CM  Polysubstance abuse: w/ tobacco & alcohol abuse. Nicotine patch to prevent w/drawal. Smoking cessation counseling. Ativan prn for alcohol w/drawal. Alcohol abuse cessation counseling   Alcohol w/drawal: continue on CIWA protocol.  Alcohol abuse cessation counseling. Pt does want to quit drinking completely and pt's last drink was approx 7 days ago (from day of admission)  Bicytopenia: platelets are WNL today. Likely secondary to alcohol abuse from bone marrow suppression. No need for transfusions currently. LDH is WNL. Will continue to monitor   Hyperbilirubinemia: resolved  Emphysema lung: continue on albuterol HFA prn.   Hypokalemia: WNL today. Will continue to monitor     DVT prophylaxis: SCDs, lovenox Code Status: full Family Communication:  Disposition Plan: will likely need couple days more as pt is in alcohol w/drawal   Consultants:   Ortho surg   Procedures:   Antimicrobials:   Subjective: Pt c/o pelvic pain and tremors still  Objective: Vitals:   06/18/19 0030 06/18/19 0736 06/18/19 0824 06/19/19 0015  BP: 120/82 (!) 138/100 138/80 (!) 141/93  Pulse: 69 87  82  Resp: 16 16  16   Temp: 98.2 F (36.8 C) 98.6 F (37 C)  98.4 F (36.9 C)  TempSrc: Oral   Oral  SpO2: 98% 100%  97%  Weight:       Height:        Intake/Output Summary (Last 24 hours) at 06/19/2019 0724 Last data filed at 06/19/2019 0530 Gross per 24 hour  Intake 2067.32 ml  Output 3225 ml  Net -1157.68 ml   Filed Weights   06/16/19 0540  Weight: 54.4 kg    Examination:  General exam: Appears uncomfortable. Appears older than stated age. Disheveled. Respiratory system: diminshed breath sounds b/l. No wheezes  Cardiovascular system: S1 & S2 +. No rubs, gallops or clicks.  Gastrointestinal system: Abdomen is nondistended, soft and nontender. Normal bowel sounds heard. Central nervous system: Alert and oriented. Moves all 4 extremities. Tremor noted Psychiatry: Judgement and insight appear normal. Normal mood and affect     Data Reviewed: I have personally reviewed following labs and imaging studies  CBC: Recent Labs  Lab 06/16/19 0545 06/17/19 0413 06/18/19 0534 06/19/19 0501  WBC 4.3 3.8* 3.2* 3.5*  NEUTROABS 2.5  --   --   --   HGB 11.5* 9.0* 9.0* 8.6*  HCT 35.4* 28.2* 27.9* 27.1*  MCV 96.2 98.9 98.9 99.3  PLT 111* 86* 106* 161   Basic Metabolic Panel: Recent Labs  Lab 06/16/19 0545 06/17/19 0413 06/18/19 0534 06/19/19 0501  NA 135 138 139 137  K 3.1* 3.4* 4.1 3.5  CL 99 110 110 106  CO2 24 22 22 25   GLUCOSE 106* 78 96 95  BUN 9 <5* <5* <5*  CREATININE 0.96 0.43* 0.60* 0.51*  CALCIUM 8.3* 7.7* 7.9* 8.3*  MG 1.6* 2.2  --   --    GFR: Estimated Creatinine Clearance: 91.6 mL/min (A) (by C-G formula based on SCr of 0.51 mg/dL (L)). Liver Function Tests: Recent Labs  Lab 06/18/19 0534 06/19/19 0501  AST 18 14*  ALT 20 16  ALKPHOS 56 54  BILITOT 1.4* 0.9  PROT 5.2* 5.1*  ALBUMIN 2.9* 2.8*   No results for input(s): LIPASE, AMYLASE in the last 168 hours. No results for input(s): AMMONIA in the last 168 hours. Coagulation Profile: No results for input(s): INR, PROTIME in the last 168 hours. Cardiac Enzymes: Recent Labs  Lab 06/16/19 0545  CKTOTAL 59   BNP (last 3  results) No results for input(s): PROBNP in the last 8760 hours. HbA1C: No results for input(s): HGBA1C in the last 72 hours. CBG: No results for input(s): GLUCAP in the last 168 hours. Lipid Profile: No results for input(s): CHOL, HDL, LDLCALC, TRIG, CHOLHDL, LDLDIRECT in the last 72 hours. Thyroid Function Tests: No results for input(s): TSH, T4TOTAL, FREET4, T3FREE, THYROIDAB in the last 72 hours. Anemia Panel: No results for input(s): VITAMINB12, FOLATE, FERRITIN, TIBC, IRON, RETICCTPCT in the last 72 hours. Sepsis Labs: No results for input(s): PROCALCITON, LATICACIDVEN in the last 168 hours.  Recent Results (from the past 240 hour(s))  SARS CORONAVIRUS 2 (TAT 6-24 HRS) Nasopharyngeal Nasopharyngeal Swab     Status: None   Collection Time: 06/16/19  8:23 AM   Specimen: Nasopharyngeal Swab  Result Value Ref Range Status   SARS Coronavirus 2 NEGATIVE NEGATIVE Final    Comment: (NOTE) SARS-CoV-2 target nucleic acids are NOT DETECTED. The SARS-CoV-2 RNA is generally detectable in upper and lower respiratory specimens during the acute phase of infection. Negative results do not preclude SARS-CoV-2 infection, do not rule out co-infections with other pathogens, and should not be used as the sole basis for treatment or other patient management decisions. Negative results must be combined with clinical observations, patient history, and epidemiological information. The expected result is Negative. Fact Sheet for Patients: SugarRoll.be Fact Sheet for Healthcare Providers: https://www.woods-mathews.com/ This test is not yet approved or cleared by the [place] FDA and  has been authorized for detection and/or diagnosis of SARS-CoV-2 by FDA under an Emergency Use Authorization (EUA). This EUA will remain  in effect (meaning this test can be used) for the duration of the COVID-19 declaration under Section 56 4(b)(1) of the Act, 21  U.S.C. section 360bbb-3(b)(1), unless the authorization is terminated or revoked sooner. Performed at [facility], [place] [address], [place], [place] [address]          Radiology Studies: No results found.      Scheduled Meds: . dextromethorphan-guaiFENesin  1 tablet Oral BID  . folic acid  1 mg Oral Daily  . LORazepam  0-4 mg Intravenous Q12H  . multivitamin with minerals  1 tablet Oral Daily  . nicotine  21 mg Transdermal Daily  . pantoprazole  40 mg Oral Q1200  . thiamine  100 mg Oral Daily   Or  . thiamine  100 mg Intravenous Daily   Continuous Infusions: . sodium chloride 125 mL/hr at 06/18/19 1117     LOS: 2 days    Time spent: 34 mins     Wyvonnia Dusky, MD [facility] Pager [phone]  If 7PM-7AM, please contact night-coverage [email or web address] 06/19/2019, 7:24 AM

## 2019-06-19 NOTE — Progress Notes (Signed)
Physical Therapy Treatment Patient Details Name: Shaun Ward MRN: [id number] DOB: Dec 31, 1975 Today's Date: 06/19/2019    History of Present Illness Pt is 44 yo male s/p fall at home, X-ray of left hip and CT-pelvis showed fractures of the L superior and inferior pubic rami and a L sacral ala fracture, and Extraperitoneal hemorrhage, largely centered upon the superior ramus/pubic body fracture with hemorrhage tracking into the sub peritoneal space and space of Retzius as well as along the left pelvic sidewall. Fluid in hemorrhage displaces the bladder, and small amount of intramuscular hemorrhage within the left operator internus likely rising from the inferior ramus fracture. per ortho consult pt can be WBAT on LLE. PMH of polysubstance abuse, etoh abuse, emphysema of lung.    PT Comments    Ready.  Bed mobility without assist.  Steady in sitting.  Stood with min guard/assist +1 with verbal cues for hand placements.  He is able to progress gait to 100' with heavy reliance on RW but no LOB's.  Fatigued with effort but is able to push himself to increase mobility without encouragement.  Will update discharge recommendations as gait has improved during stay and he has good family support to assist.  No stairs into his discharge destination.   Follow Up Recommendations  Home health PT;Supervision for mobility/OOB     Equipment Recommendations  Rolling walker with 5" wheels;3in1 (PT)    Recommendations for Other Services       Precautions / Restrictions Precautions Precaution Comments: superior and inferior pubic rami fx (L), ala fx; extraperitoneal hemorrhage Restrictions Weight Bearing Restrictions: Yes LLE Weight Bearing: Weight bearing as tolerated    Mobility  Bed Mobility Overal bed mobility: Modified Independent Bed Mobility: Supine to Sit;Sit to Supine Rolling: Modified independent (Device/Increase time)   Supine to sit: Modified independent (Device/Increase time) Sit to  supine: Modified independent (Device/Increase time)   General bed mobility comments: increased time but no assist  Transfers Overall transfer level: Needs assistance Equipment used: Rolling walker (2 wheeled) Transfers: Sit to/from Stand Sit to Stand: Min assist;From elevated surface            Ambulation/Gait Ambulation/Gait assistance: Min guard Gait Distance (Feet): 100 Feet Assistive device: Rolling walker (2 wheeled) Gait Pattern/deviations: Step-to pattern;Ataxic;Trunk flexed;Decreased stance time - left Gait velocity: decreased   General Gait Details: Overall improved gait today with pt pushing himself to increase distance.   Stairs             Wheelchair Mobility    Modified Rankin (Stroke Patients Only)       Balance Overall balance assessment: Needs assistance Sitting-balance support: Feet supported Sitting balance-Leahy Scale: Good     Standing balance support: Bilateral upper extremity supported Standing balance-Leahy Scale: Fair Standing balance comment: heavily relys on UEs for support 2/2 to pain however no LOB noted.                            Cognition Arousal/Alertness: Awake/alert Behavior During Therapy: WFL for tasks assessed/performed Overall Cognitive Status: Within Functional Limits for tasks assessed                                 General Comments: Pt is A and O x 4. Very cooperative and pleasant throughout. No issues following commands.      Exercises      General Comments  Pertinent Vitals/Pain Pain Assessment: 0-10 Pain Score: 8  Pain Location: L hip/low back Pain Descriptors / Indicators: Aching;Grimacing;Guarding Pain Intervention(s): Limited activity within patient's tolerance;Repositioned    Home Living                      Prior Function            PT Goals (current goals can now be found in the care plan section) Progress towards PT goals: Progressing toward  goals    Frequency    7X/week      PT Plan Discharge plan needs to be updated    Co-evaluation              AM-PAC PT "6 Clicks" Mobility   Outcome Measure  Help needed turning from your back to your side while in a flat bed without using bedrails?: None Help needed moving from lying on your back to sitting on the side of a flat bed without using bedrails?: None Help needed moving to and from a bed to a chair (including a wheelchair)?: A Little Help needed standing up from a chair using your arms (e.g., wheelchair or bedside chair)?: A Little Help needed to walk in hospital room?: A Little Help needed climbing 3-5 steps with a railing? : A Lot 6 Click Score: 19    End of Session Equipment Utilized During Treatment: Gait belt Activity Tolerance: Patient tolerated treatment well Patient left: in bed;with call bell/phone within reach;with bed alarm set;with family/visitor present Nurse Communication: Mobility status Pain - Right/Left: Left Pain - part of body: Hip     Time: 5732-2025 PT Time Calculation (min) (ACUTE ONLY): 12 min  Charges:  $Gait Training: 8-22 mins                    Chesley Noon, PTA 06/19/19, 11:58 AM

## 2019-06-19 NOTE — Progress Notes (Signed)
PHARMACIST - PHYSICIAN COMMUNICATION  CONCERNING:  Enoxaparin (Lovenox) for DVT Prophylaxis    RECOMMENDATION: Patient was prescribed enoxaprin 40mg  q24 hours for VTE prophylaxis.   Filed Weights   06/16/19 0540  Weight: 120 lb (54.4 kg)    Body mass index is 18.79 kg/m.  Estimated Creatinine Clearance: 91.6 mL/min (A) (by C-G formula based on SCr of 0.51 mg/dL (L)).  Patient is candidate for enoxaparin 30mg  every 24 hours based on Weight less then <57kg for men   DESCRIPTION: Pharmacy has adjusted enoxaparin dose per [facility] policy.  Patient is now receiving enoxaparin 30mg  every 24 hours.  Leonor Darnell, PharmD Clinical Pharmacist  06/19/2019 12:14 PM

## 2019-06-19 NOTE — TOC Progression Note (Signed)
Transition of Care [facility]) - Progression Note    Patient Details  Name: RAWLEIGH RODE MRN: [id number] Date of Birth: November 11, 1975  Transition [facility]) CM/SW Contact  Maud Deed, LCSW Phone [phone] 06/19/2019, 2:28 PM  Clinical Narrative:    I spoke with patient at bedside. HE was agreeable to [facility] PT. MD recommended resources for Substance abuse, CSW provided him with those resources for Inpatient and Outpatient recovery.   TOC will continue to follow for discharge planning needs.   Expected Discharge Plan: Home w Home Health Services Barriers to Discharge: Inadequate or no insurance, Continued Medical Work up  Expected Discharge Plan and Services Expected Discharge Plan: Home w Home Health Services In-house Referral: Clinical Social Work   Post Acute Care Choice: Horticulturist, commercial, Home Health Living arrangements for the past 2 months: Single Family Home                                       Social Determinants of Health (SDOH) Interventions    Readmission Risk Interventions No flowsheet data found.

## 2019-06-20 LAB — COMPREHENSIVE METABOLIC PANEL
ALT: 14 U/L (ref 0–44)
AST: 16 U/L (ref 15–41)
Albumin: 2.7 g/dL — ABNORMAL LOW (ref 3.5–5.0)
Alkaline Phosphatase: 47 U/L (ref 38–126)
Anion gap: 9 (ref 5–15)
BUN: <5 mg/dL — ABNORMAL LOW (ref 6–20)
CO2: 24 mmol/L (ref 22–32)
Calcium: 8 mg/dL — ABNORMAL LOW (ref 8.9–10.3)
Chloride: 108 mmol/L (ref 98–111)
Creatinine, Ser: 0.52 mg/dL — ABNORMAL LOW (ref 0.61–1.24)
GFR calc Af Amer: >60 mL/min (ref >60)
GFR calc non Af Amer: >60 mL/min (ref >60)
Glucose, Bld: 80 mg/dL (ref 70–99)
Potassium: 3.5 mmol/L (ref 3.5–5.1)
Sodium: 141 mmol/L (ref 135–145)
Total Bilirubin: 1.1 mg/dL (ref 0.3–1.2)
Total Protein: 5.1 g/dL — ABNORMAL LOW (ref 6.5–8.1)

## 2019-06-20 LAB — CBC
HCT: 27.4 % — ABNORMAL LOW (ref 39.0–52.0)
Hemoglobin: 8.7 g/dL — ABNORMAL LOW (ref 13.0–17.0)
MCH: 31.3 pg (ref 26.0–34.0)
MCHC: 31.8 g/dL (ref 30.0–36.0)
MCV: 98.6 fL (ref 80.0–100.0)
Platelets: 229 10*3/uL (ref 150–400)
RBC: 2.78 MIL/uL — ABNORMAL LOW (ref 4.22–5.81)
RDW: 15.3 % (ref 11.5–15.5)
WBC: 3.5 10*3/uL — ABNORMAL LOW (ref 4.0–10.5)
nRBC: 0 % (ref 0.0–0.2)

## 2019-06-20 MED ORDER — CHLORDIAZEPOXIDE HCL 25 MG PO CAPS: 25.0000 mg | ORAL_CAPSULE | Freq: Three times a day (TID) | ORAL | 0 refills | Status: AC | PRN

## 2019-06-20 MED ORDER — OXYCODONE-ACETAMINOPHEN 5-325 MG PO TABS: 1.0000 | ORAL_TABLET | Freq: Four times a day (QID) | ORAL | 0 refills | Status: AC | PRN

## 2019-06-20 MED ORDER — ASPIRIN 325 MG PO TABS: 325.0000 mg | ORAL_TABLET | Freq: Two times a day (BID) | ORAL | 0 refills | Status: AC

## 2019-06-20 NOTE — TOC Progression Note (Signed)
Transition of Care [facility]) - Progression Note    Patient Details  Name: Shaun Ward MRN: [id number] Date of Birth: [birth year]  Transition [facility] Care [facility]) CM/SW Contact  Aeisha Minarik, Gardiner Rhyme, LCSW Phone Number: 06/20/2019, 9:15 AM  Clinical Narrative:   Met with pt have ordered rw and 3 in 1-[employer] and [place] for home with Mom. Pt reports he is doing better and feels in less pain. Will connect with [facility] and assist with medications. Will await MD seeing and readiness for discharge.    Expected Discharge Plan: [place] Barriers to Discharge: Inadequate or no insurance, Continued Medical Work up  Expected Discharge Plan and Services Expected Discharge Plan: [place] In-house Referral: Clinical Social Work   Post Acute Care Choice: Museum/gallery conservator, Home Health Living arrangements for the past 2 months: Single Family Home                                       Social Determinants of Health (SDOH) Interventions    Readmission Risk Interventions No flowsheet data found.

## 2019-06-20 NOTE — Discharge Summary (Signed)
Physician Discharge Summary  Shaun Ward WLS:[id number] DOB: Nov 16, 1975 DOA: 06/16/2019  PCP: Patient, No Pcp Per  Admit date: 06/16/2019 Discharge date: 06/20/2019  Admitted From: home Disposition: home w/ home health  Recommendations for Outpatient Follow-up:  1. Follow up with PCP in 1-2 weeks 2. F/u ortho surg (Dr. Adrian Prows) in 2 weeks  Home Health: yes Equipment/Devices: walker, 3n1  Discharge Condition: stable CODE STATUS: full  Diet recommendation: regular   Brief/Interim Summary: HPI was taken from Dr. Clyde Lundborg: Shaun Ward is a 44 y.o. male with medical history significant of polysubstance abuse, tobacco abuse, alcohol abuse, emphysema of lung, who presents with fall.  Pt states that he fell when he was climbing up to try to turn off a ceiling fan yesterday morning. No LOC. He injured his left hip, causing severe pain. The left hip pain is constant, sharp, severe, radiating down to the left leg.He states he was unable to ambulate. He denies head injury or neck injury.  No unilateral numbness or tingling his extremities for no facial droop or slurred speech.  Patient does not have chest pain.  Patient states that he has mild dry cough and mild shortness of breath due to smoking and emphysema of lung, which has not changed.  No fever or chills.  Patient has nausea and vomited once.  He had one loose stool bowel movement.  Currently no diarrhea or abdominal pain.  No symptoms of UTI. In addition, the patient reports that he previously was drinking 12-18 beers a day and quit on his own 7 days ago. He reports that he has had tremors and hallucinations, although states that these symptoms were at their worst about 2 days ago and have now been improving.  ED Course: pt was found to have K3.1, WBC 4.3, Platelet 111, pending Covid PCR test, renal function okay, pending ethanol level, pending CK level, temperature normal, blood pressure 124/88, heart rate 85, RR 17, oxygen saturation  100% on room air. X-ray of left hip and CT-pelvis showed fractures of the L superior and inferior pubic rami and a L sacral ala fracture. Pt is placed on MedSurg bed for observation.  Orthopedic surgeon, Dr. Signa Kell was consulted.  CT-pelvis: 1.Constellation of findings compatible with a lateral compression type 1 injury including comminuted displaced fractures of the superior ramus extending into the pubic body, inferior ramus, and a left sacral compression fracture.  2. Extraperitoneal hemorrhage, largely centered upon the superior ramus/pubic body fracture with hemorrhage tracking into the sub peritoneal space and space of Retzius as well as along the left pelvic sidewall. Fluid in hemorrhage displaces the bladder without convincing evidence of direct bladder injury.  3. Small amount of intramuscular hemorrhage within the left operator internus likely rising from the inferior ramus fracture  Hospital Course from Dr. Wilfred Lacy: Pt was found to have pelvic fractures so ortho surg was consulted. Ortho surg did not recommend any surgical intervention but recommended PT/OT. PT/OT saw the pt and recommended home health. Pt was sent home w/ aspirin 325mg  BID x 4 weeks for DVT prophylaxis as per ortho surgery. Home health was set up by CM prior to d/c. Furthermore, pt was placed on CIWA protocol for alcohol w/drawal. Pt was sent home w/ po librium prn for w/drawal symptoms as well as substance abuse resources. Pt was counseled on ADRs of pain meds including but limiting to addiction potential, constipation, respiratory depression, not taking narcotics w/ alcohol and death. Pt verbalized his understanding.  Discharge Diagnoses:  Principal Problem:   Fall at home, initial encounter Active Problems:   Pelvic fracture (HCC)   Tobacco abuse   Alcohol abuse   Polysubstance abuse (HCC)   Emphysema lung (HCC)   Fall   Hypokalemia   Thrombocytopenia (HCC)   Alcohol withdrawal  (HCC)   Pelvic fracture: secondary to fall at home. No surgery as per ortho surgery. Weight bearing as tolerated on LLE. [facility] approximately 2 weeks after discharge. Morphine, percocet, robaxin prn. PT recs SNF & OT recs home health. Pt wanted to go home w/ home health   Polysubstance abuse: w/ tobacco & alcohol abuse. Nicotine patch to prevent w/drawal. Smoking cessation counseling. Ativan prn for alcohol w/drawal while inpatient. Alcohol abuse cessation counseling   Alcohol w/drawal: transitioned to po librium.  Alcohol abuse cessation counseling. Pt does want to quit drinking completely and pt's last drink was approx 7 days ago (from day of admission)  Bicytopenia: platelets are WNL today. Likely secondary to alcohol abuse from bone marrow suppression. No need for transfusions currently. LDH is WNL. Will continue to monitor   Hyperbilirubinemia: resolved  Emphysema lung: continue on albuterol HFA prn.   Hypokalemia: WNL today. Will continue to monitor  Discharge Instructions  Discharge Instructions    Diet general   Complete by: As directed    Discharge instructions   Complete by: As directed    W/ PCP in 1-2 weeks; F/u ortho surg (Dr. Signa KellSunny Patel) in 1-2 weeks   Increase activity slowly   Complete by: As directed      Allergies as of 06/20/2019   No Known Allergies     Medication List    TAKE these medications   aspirin 325 MG tablet Commonly known as: Bayer Aspirin Take 1 tablet (325 mg total) by mouth in the morning and at bedtime.   chlordiazePOXIDE 25 MG capsule Commonly known as: LIBRIUM Take 1 capsule (25 mg total) by mouth 3 (three) times daily as needed for up to 7 days for anxiety or withdrawal.   diphenhydramine-acetaminophen 25-500 MG Tabs tablet Commonly known as: TYLENOL PM Take 1 tablet by mouth at bedtime as needed.   oxyCODONE-acetaminophen 5-325 MG tablet Commonly known as: PERCOCET/ROXICET Take 1 tablet by mouth  every 6 (six) hours as needed for up to 5 days for moderate pain or severe pain.            Durable Medical Equipment  (From admission, onward)         Start     Ordered   06/20/19 0831  For home use only DME 3 n 1  Once     06/20/19 0830   06/19/19 0931  For home use only DME Walker rolling  Once    Question Answer Comment  Walker: With 5 Inch Wheels   Patient needs a walker to treat with the following condition Pelvic fracture (HCC)      06/19/19 0930         Follow-up Information    Signa KellPatel, Sunny, MD On 07/06/2019.   Specialty: Orthopedic Surgery Why: @ 11:00 am Contact information: [address] [place] [address] [phone]          No Known Allergies  Consultations:  Ortho surgery, Dr. Adrian ProwsSonny Patel   Procedures/Studies: CT HEAD WO CONTRAST  Result Date: 06/16/2019 CLINICAL DATA:  Headache.  Fall. EXAM: CT HEAD WITHOUT CONTRAST TECHNIQUE: Contiguous axial images were obtained from the base of the skull  through the vertex without intravenous contrast. COMPARISON:  None. FINDINGS: Brain: There is mild diffuse atrophy. There is no intracranial mass, hemorrhage, extra-axial fluid collection, or midline shift. Brain parenchyma appears unremarkable. No demonstrable acute infarct. Vascular: There is no hyperdense vessel. No appreciable vascular calcification. Skull: The bony calvarium appears intact. Sinuses/Orbits: There is opacification and mucosal thickening in several ethmoid air cells. There is also opacification in the posterior right sphenoid sinus. Orbits appear symmetric bilaterally. Other: Mastoid air cells are clear. IMPRESSION: Mild atrophy. No evident mass or hemorrhage. No findings suggesting acute infarct. Areas of paranasal sinus disease. Electronically Signed   By: Bretta Bang III M.D.   On: 06/16/2019 08:55   CT PELVIS WO CONTRAST  Result Date: 06/16/2019 CLINICAL DATA:  Hip pain, ground level fall, abnormal radiograph EXAM: CT  PELVIS WITHOUT CONTRAST TECHNIQUE: Multidetector CT imaging of the pelvis was performed following the standard protocol without intravenous contrast. COMPARISON:  Same day hip radiograph FINDINGS: Urinary Tract: Distal ureters are unremarkable. No bladder wall thickening, calculi or debris. No convincing evidence of direct bladder injury, intraperitoneal or extraperitoneal rupture. Anterolateral bladder base is partially displaced by extraperitoneal hemorrhage arising from the left pelvic fractures. Bowel: Normal appendix. No large or small bowel wall thickening or dilatation. No evidence of obstruction. Vascular/Lymphatic: Evaluation of the vasculature limited in the absence of contrast media. Minimal atherosclerotic plaque within the iliac arteries and common femoral arteries bilaterally. Reproductive: Mild prostatomegaly and hypertrophic changes of the seminal vesicles. Other: Extraperitoneal hemorrhage appears centered upon the left pubic body and inferior rami fractures with small amount of adjacent intramuscular hemorrhage as well as hemorrhage in stranding in the space of Retzius partially displacing the left anterolateral bladder without convincing evidence of bladder injury. Musculoskeletal: There is a comminuted fracture involving the superior pubic ramus extending into the pubic root and symphysis pubis without abnormal diastatic symphyseal widening. There is a minimally displaced fracture of the left inferior pubic ramus with slight thickening and likely intramuscular hemorrhage of the adjacent operator internus. Furthermore, there is a ipsilateral left anterior sacral ala compression fracture. No abnormal widening of the SI joints. No right pelvic fractures are seen. Proximal femoral well seated and intact. IMPRESSION: Constellation of findings compatible with a lateral compression type 1 injury including comminuted displaced fractures of the superior ramus extending into the pubic body, inferior ramus,  and a left sacral compression fracture. Extraperitoneal hemorrhage, largely centered upon the superior ramus/pubic body fracture with hemorrhage tracking into the sub peritoneal space and space of Retzius as well as along the left pelvic sidewall. Fluid in hemorrhage displaces the bladder without convincing evidence of direct bladder injury. Small amount of intramuscular hemorrhage within the left operator internus likely rising from the inferior ramus fracture. Electronically Signed   By: Kreg Shropshire M.D.   On: 06/16/2019 07:05   DG Hip Unilat W or Wo Pelvis 2-3 Views Left  Result Date: 06/16/2019 CLINICAL DATA:  Left hip pain after fall EXAM: DG HIP (WITH OR WITHOUT PELVIS) 2-3V LEFT COMPARISON:  None. FINDINGS: Mildly displaced fractures of the left superior pubic ramus extending into the pubic root and left inferior pubic ramus. The remaining bones of the pelvis are intact and congruent. No abnormal diastatic widening of the SI joints or symphysis pubis. Evaluation the sacrum is limited due to overlying bowel gas. Questionable discontinuity of the arcuate lines of the left sacral ala (see annotated image). Proximal femora are intact and normally located. Mild degenerative changes in the hips. IMPRESSION:  Mildly displaced fractures of the left superior pubic ramus extending into the pubic root and left inferior pubic ramus. Questionable fracture of the left sacral ala as well. Findings suspicious for an LC1 lateral compression injury. Consider further evaluation with CT imaging to further clarify the sacral fracture. These results were called by telephone at the time of interpretation on 06/16/2019 at 6:28 am to provider [facility] , who verbally acknowledged these results. Electronically Signed   By: Kreg Shropshire M.D.   On: 06/16/2019 06:31       Subjective: Pt c/o pelvic pain   Discharge Exam: Vitals:   06/19/19 2359 06/20/19 0822  BP: (!) 139/95 (!) 145/87  Pulse: 86 90  Resp: 16    Temp: 98.5 F (36.9 C) 99.1 F (37.3 C)  SpO2: 97% 96%   Vitals:   06/19/19 0015 06/19/19 0752 06/19/19 2359 06/20/19 0822  BP: (!) 141/93 (!) 141/98 (!) 139/95 (!) 145/87  Pulse: 82 88 86 90  Resp: 16 18 16    Temp: 98.4 F (36.9 C) 98.8 F (37.1 C) 98.5 F (36.9 C) 99.1 F (37.3 C)  TempSrc: Oral Oral Oral Oral  SpO2: 97% 98% 97% 96%  Weight:      Height:        General: Pt is alert, awake, not in acute distress Cardiovascular: S1/S2 +, no rubs, no gallops Respiratory: CTA bilaterally, no wheezing, no rhonchi, or rales Abdominal: Soft, NT, ND, bowel sounds + Extremities: no edema, no cyanosis    The results of significant diagnostics from this hospitalization (including imaging, microbiology, ancillary and laboratory) are listed below for reference.     Microbiology: Recent Results (from the past 240 hour(s))  SARS CORONAVIRUS 2 (TAT 6-24 HRS) Nasopharyngeal Nasopharyngeal Swab     Status: None   Collection Time: 06/16/19  8:23 AM   Specimen: Nasopharyngeal Swab  Result Value Ref Range Status   SARS Coronavirus 2 NEGATIVE NEGATIVE Final    Comment: (NOTE) SARS-CoV-2 target nucleic acids are NOT DETECTED. The SARS-CoV-2 RNA is generally detectable in upper and lower respiratory specimens during the acute phase of infection. Negative results do not preclude SARS-CoV-2 infection, do not rule out co-infections with other pathogens, and should not be used as the sole basis for treatment or other patient management decisions. Negative results must be combined with clinical observations, patient history, and epidemiological information. The expected result is Negative. Fact Sheet for Patients: HairSlick.no Fact Sheet for Healthcare Providers: quierodirigir.com This test is not yet approved or cleared by the [place] FDA and  has been authorized for detection and/or diagnosis of SARS-CoV-2 by FDA under an  Emergency Use Authorization (EUA). This EUA will remain  in effect (meaning this test can be used) for the duration of the COVID-19 declaration under Section 56 4(b)(1) of the Act, 21 U.S.C. section 360bbb-3(b)(1), unless the authorization is terminated or revoked sooner. Performed at [facility], [address] [place], [place] [address]      Labs: BNP (last 3 results) No results for input(s): BNP in the last 8760 hours. Basic Metabolic Panel: Recent Labs  Lab 06/16/19 0545 06/17/19 0413 06/18/19 0534 06/19/19 0501 06/20/19 0337  NA 135 138 139 137 141  K 3.1* 3.4* 4.1 3.5 3.5  CL 99 110 110 106 108  CO2 24 22 22 25 24   GLUCOSE 106* 78 96 95 80  BUN 9 <5* <5* <5* <5*  CREATININE 0.96 0.43* 0.60* 0.51* 0.52*  CALCIUM 8.3* 7.7* 7.9* 8.3*  8.0*  MG 1.6* 2.2  --   --   --    Liver Function Tests: Recent Labs  Lab 06/18/19 0534 06/19/19 0501 06/20/19 0337  AST 18 14* 16  ALT 20 16 14   ALKPHOS 56 54 47  BILITOT 1.4* 0.9 1.1  PROT 5.2* 5.1* 5.1*  ALBUMIN 2.9* 2.8* 2.7*   No results for input(s): LIPASE, AMYLASE in the last 168 hours. No results for input(s): AMMONIA in the last 168 hours. CBC: Recent Labs  Lab 06/16/19 0545 06/17/19 0413 06/18/19 0534 06/19/19 0501 06/20/19 0337  WBC 4.3 3.8* 3.2* 3.5* 3.5*  NEUTROABS 2.5  --   --   --   --   HGB 11.5* 9.0* 9.0* 8.6* 8.7*  HCT 35.4* 28.2* 27.9* 27.1* 27.4*  MCV 96.2 98.9 98.9 99.3 98.6  PLT 111* 86* 106* 161 229   Cardiac Enzymes: Recent Labs  Lab 06/16/19 0545  CKTOTAL 59   BNP: Invalid input(s): POCBNP CBG: No results for input(s): GLUCAP in the last 168 hours. D-Dimer No results for input(s): DDIMER in the last 72 hours. Hgb A1c No results for input(s): HGBA1C in the last 72 hours. Lipid Profile No results for input(s): CHOL, HDL, LDLCALC, TRIG, CHOLHDL, LDLDIRECT in the last 72 hours. Thyroid function studies No results for input(s): TSH, T4TOTAL, T3FREE, THYROIDAB in the last 72  hours.  Invalid input(s): FREET3 Anemia work up No results for input(s): VITAMINB12, FOLATE, FERRITIN, TIBC, IRON, RETICCTPCT in the last 72 hours. Urinalysis    Component Value Date/Time   COLORURINE YELLOW (A) 06/16/2019 0737   APPEARANCEUR CLEAR (A) 06/16/2019 0737   APPEARANCEUR Clear 01/25/2014 1544   LABSPEC 1.009 06/16/2019 0737   LABSPEC 1.013 01/25/2014 1544   PHURINE 6.0 06/16/2019 0737   GLUCOSEU NEGATIVE 06/16/2019 0737   GLUCOSEU Negative 01/25/2014 1544   HGBUR NEGATIVE 06/16/2019 0737   BILIRUBINUR NEGATIVE 06/16/2019 0737   BILIRUBINUR Negative 01/25/2014 1544   KETONESUR 5 (A) 06/16/2019 0737   PROTEINUR NEGATIVE 06/16/2019 0737   NITRITE NEGATIVE 06/16/2019 0737   LEUKOCYTESUR NEGATIVE 06/16/2019 0737   LEUKOCYTESUR Negative 01/25/2014 1544   Sepsis Labs Invalid input(s): PROCALCITONIN,  WBC,  LACTICIDVEN Microbiology Recent Results (from the past 240 hour(s))  SARS CORONAVIRUS 2 (TAT 6-24 HRS) Nasopharyngeal Nasopharyngeal Swab     Status: None   Collection Time: 06/16/19  8:23 AM   Specimen: Nasopharyngeal Swab  Result Value Ref Range Status   SARS Coronavirus 2 NEGATIVE NEGATIVE Final    Comment: (NOTE) SARS-CoV-2 target nucleic acids are NOT DETECTED. The SARS-CoV-2 RNA is generally detectable in upper and lower respiratory specimens during the acute phase of infection. Negative results do not preclude SARS-CoV-2 infection, do not rule out co-infections with other pathogens, and should not be used as the sole basis for treatment or other patient management decisions. Negative results must be combined with clinical observations, patient history, and epidemiological information. The expected result is Negative. Fact Sheet for Patients: 06/18/19 Fact Sheet for Healthcare Providers: HairSlick.no This test is not yet approved or cleared by the [place] FDA and  has been authorized for  detection and/or diagnosis of SARS-CoV-2 by FDA under an Emergency Use Authorization (EUA). This EUA will remain  in effect (meaning this test can be used) for the duration of the COVID-19 declaration under Section 56 4(b)(1) of the Act, 21 U.S.C. section 360bbb-3(b)(1), unless the authorization is terminated or revoked sooner. Performed at [facility], [address] [place], [place]  Time coordinating discharge: Over 30 minutes  SIGNED:   Wyvonnia Dusky, MD  [facility] 06/20/2019, 2:00 PM Pager   If 7PM-7AM, please contact night-coverage [email or web address]

## 2019-06-20 NOTE — Progress Notes (Signed)
Physical Therapy Treatment Patient Details Name: Shaun Ward MRN: [id number] DOB: 12/23/1975 Today's Date: 06/20/2019    History of Present Illness Pt is 44 yo male s/p fall at home, X-ray of left hip and CT-pelvis showed fractures of the L superior and inferior pubic rami and a L sacral ala fracture, and Extraperitoneal hemorrhage, largely centered upon the superior ramus/pubic body fracture with hemorrhage tracking into the sub peritoneal space and space of Retzius as well as along the left pelvic sidewall. Fluid in hemorrhage displaces the bladder, and small amount of intramuscular hemorrhage within the left operator internus likely rising from the inferior ramus fracture. per ortho consult pt can be WBAT on LLE. PMH of polysubstance abuse, etoh abuse, emphysema of lung.    PT Comments    Pt ready for session.  Hoping to go home today.  Bed mobility without assist.  He is able to stand and complete a full lap on unit with min guard.  Overall progressing well.  Gait remains slow and dependant on walker but no LOB or buckling noted.   Follow Up Recommendations  Home health PT;Supervision for mobility/OOB     Equipment Recommendations  Rolling walker with 5" wheels;3in1 (PT)    Recommendations for Other Services       Precautions / Restrictions Precautions Precautions: Fall Restrictions Weight Bearing Restrictions: Yes LLE Weight Bearing: Weight bearing as tolerated    Mobility  Bed Mobility Overal bed mobility: Modified Independent                Transfers Overall transfer level: Needs assistance Equipment used: Rolling walker (2 wheeled) Transfers: Sit to/from Stand Sit to Stand: Min guard            Ambulation/Gait Ambulation/Gait assistance: Min guard Gait Distance (Feet): 160 Feet Assistive device: Rolling walker (2 wheeled) Gait Pattern/deviations: Step-to pattern;Step-through pattern;Decreased step length - right;Decreased step length - left;Trunk  flexed Gait velocity: decreased   General Gait Details: irregular pattern but conitnues with daily improvement in qulaity and tolerance.   Stairs             Wheelchair Mobility    Modified Rankin (Stroke Patients Only)       Balance Overall balance assessment: Needs assistance Sitting-balance support: Feet supported Sitting balance-Leahy Scale: Good     Standing balance support: Bilateral upper extremity supported Standing balance-Leahy Scale: Fair Standing balance comment: heavily relys on UEs for support 2/2 to pain however no LOB noted.                            Cognition Arousal/Alertness: Awake/alert Behavior During Therapy: WFL for tasks assessed/performed Overall Cognitive Status: Within Functional Limits for tasks assessed                                        Exercises      General Comments        Pertinent Vitals/Pain Pain Assessment: Faces Faces Pain Scale: Hurts little more Pain Location: L hip/low back Pain Descriptors / Indicators: Aching;Grimacing;Guarding Pain Intervention(s): Limited activity within patient's tolerance;Premedicated before session    Home Living                      Prior Function            PT Goals (current goals can now be  found in the care plan section) Progress towards PT goals: Progressing toward goals    Frequency    7X/week      PT Plan Current plan remains appropriate    Co-evaluation              AM-PAC PT "6 Clicks" Mobility   Outcome Measure  Help needed turning from your back to your side while in a flat bed without using bedrails?: None Help needed moving from lying on your back to sitting on the side of a flat bed without using bedrails?: None Help needed moving to and from a bed to a chair (including a wheelchair)?: A Little Help needed standing up from a chair using your arms (e.g., wheelchair or bedside chair)?: A Little Help needed to walk  in hospital room?: A Little Help needed climbing 3-5 steps with a railing? : A Lot 6 Click Score: 19    End of Session Equipment Utilized During Treatment: Gait belt Activity Tolerance: Patient tolerated treatment well Patient left: in bed;with call bell/phone within reach;with bed alarm set Nurse Communication: Mobility status Pain - Right/Left: Left Pain - part of body: Hip     Time: 1000-1010 PT Time Calculation (min) (ACUTE ONLY): 10 min  Charges:  $Gait Training: 8-22 mins                    Danielle Dess, PTA 06/20/19, 10:16 AM

## 2019-06-20 NOTE — TOC Transition Note (Signed)
Transition of Care [facility]) - CM/SW Discharge Note   Patient Details  Name: Shaun Ward MRN: [id number] Date of Birth: 07-02-1975  Transition of Care [facility]) CM/SW Contact:  Elease Hashimoto, LCSW Phone Number: 06/20/2019, 11:53 AM   Clinical Narrative:   Met with pt and father who is in his room, to discuss discharge needs. Have ordered a rw and 3 in 1 in the room already. Also HHPT & OT to provide follow up via [place]. Have given him information on [facility] and [facility] for assist with PCP and medications. MD aware of this and will electronically send them once hears from ortho. Pt ready to go home with Mom who can assist him. Has substance abuse resources. Ready for DC. Dad aware of [facility] and will stop by there to pick up meds before taking pt home.    Final next level of care: [place] Barriers to Discharge: Barriers Resolved   Patient Goals and CMS Choice Patient states their goals for this hospitalization and ongoing recovery are:: I hope to be in less pain and moving with a walker before I go home      Discharge Placement                Patient to be transferred to facility by: Father via car Name of family member notified: father here Patient and family notified of of transfer: 06/20/19  Discharge Plan and Services In-house Referral: Clinical Social Work   Post Acute Care Choice: Museum/gallery conservator, Home Health          DME Arranged: 3-N-1, Walker rolling DME Agency: [employer] Date DME Agency Contacted: 06/20/19 Time DME Agency Contacted: 1000 Representative spoke with at DME Agency: [place]: PT, OT [place] Agency: [facility]formerly [employer] Date [place]: 06/20/19 Time [place]: 1000 Representative spoke with at [place]: [place] Interventions     Readmission Risk Interventions No  flowsheet data found.

## 2019-06-20 NOTE — Progress Notes (Signed)
Discharge summary reviewed with verbal understanding. BSC and walker in room for discharge. Escorted to personal vehicle

## 2019-06-21 ENCOUNTER — Telehealth: Payer: Self-pay | Admitting: General Practice

## 2019-06-21 NOTE — Telephone Encounter (Signed)
Called pt st 1:05, went straight to vm, left message to call back [facility] with any application questions - Weston Brass

## 2019-07-07 ENCOUNTER — Telehealth: Payer: Self-pay | Admitting: Gerontology

## 2019-07-07 NOTE — Telephone Encounter (Signed)
Tried to call to discuss potentially being seen- phone immediately went to voicemail

## 2019-07-13 ENCOUNTER — Telehealth: Payer: Self-pay | Admitting: Gerontology

## 2019-07-13 NOTE — Telephone Encounter (Signed)
Pt did not pick up, LVM to ask for an update

## 2019-10-26 ENCOUNTER — Telehealth: Payer: Self-pay | Admitting: General Practice

## 2019-10-26 NOTE — Telephone Encounter (Signed)
Individual has been contacted regarding ED referral 4+ times and has been given information regarding the clinic. No further attempts to contact individual will be made.

## 2020-04-20 IMAGING — CR CHEST - 2 VIEW
2 series · 2 of 2 positions shown · non-contrast
Comparison: November 08, 2018

CLINICAL DATA: Shortness of breath

EXAM:
CHEST - 2 VIEW

[chest pa]
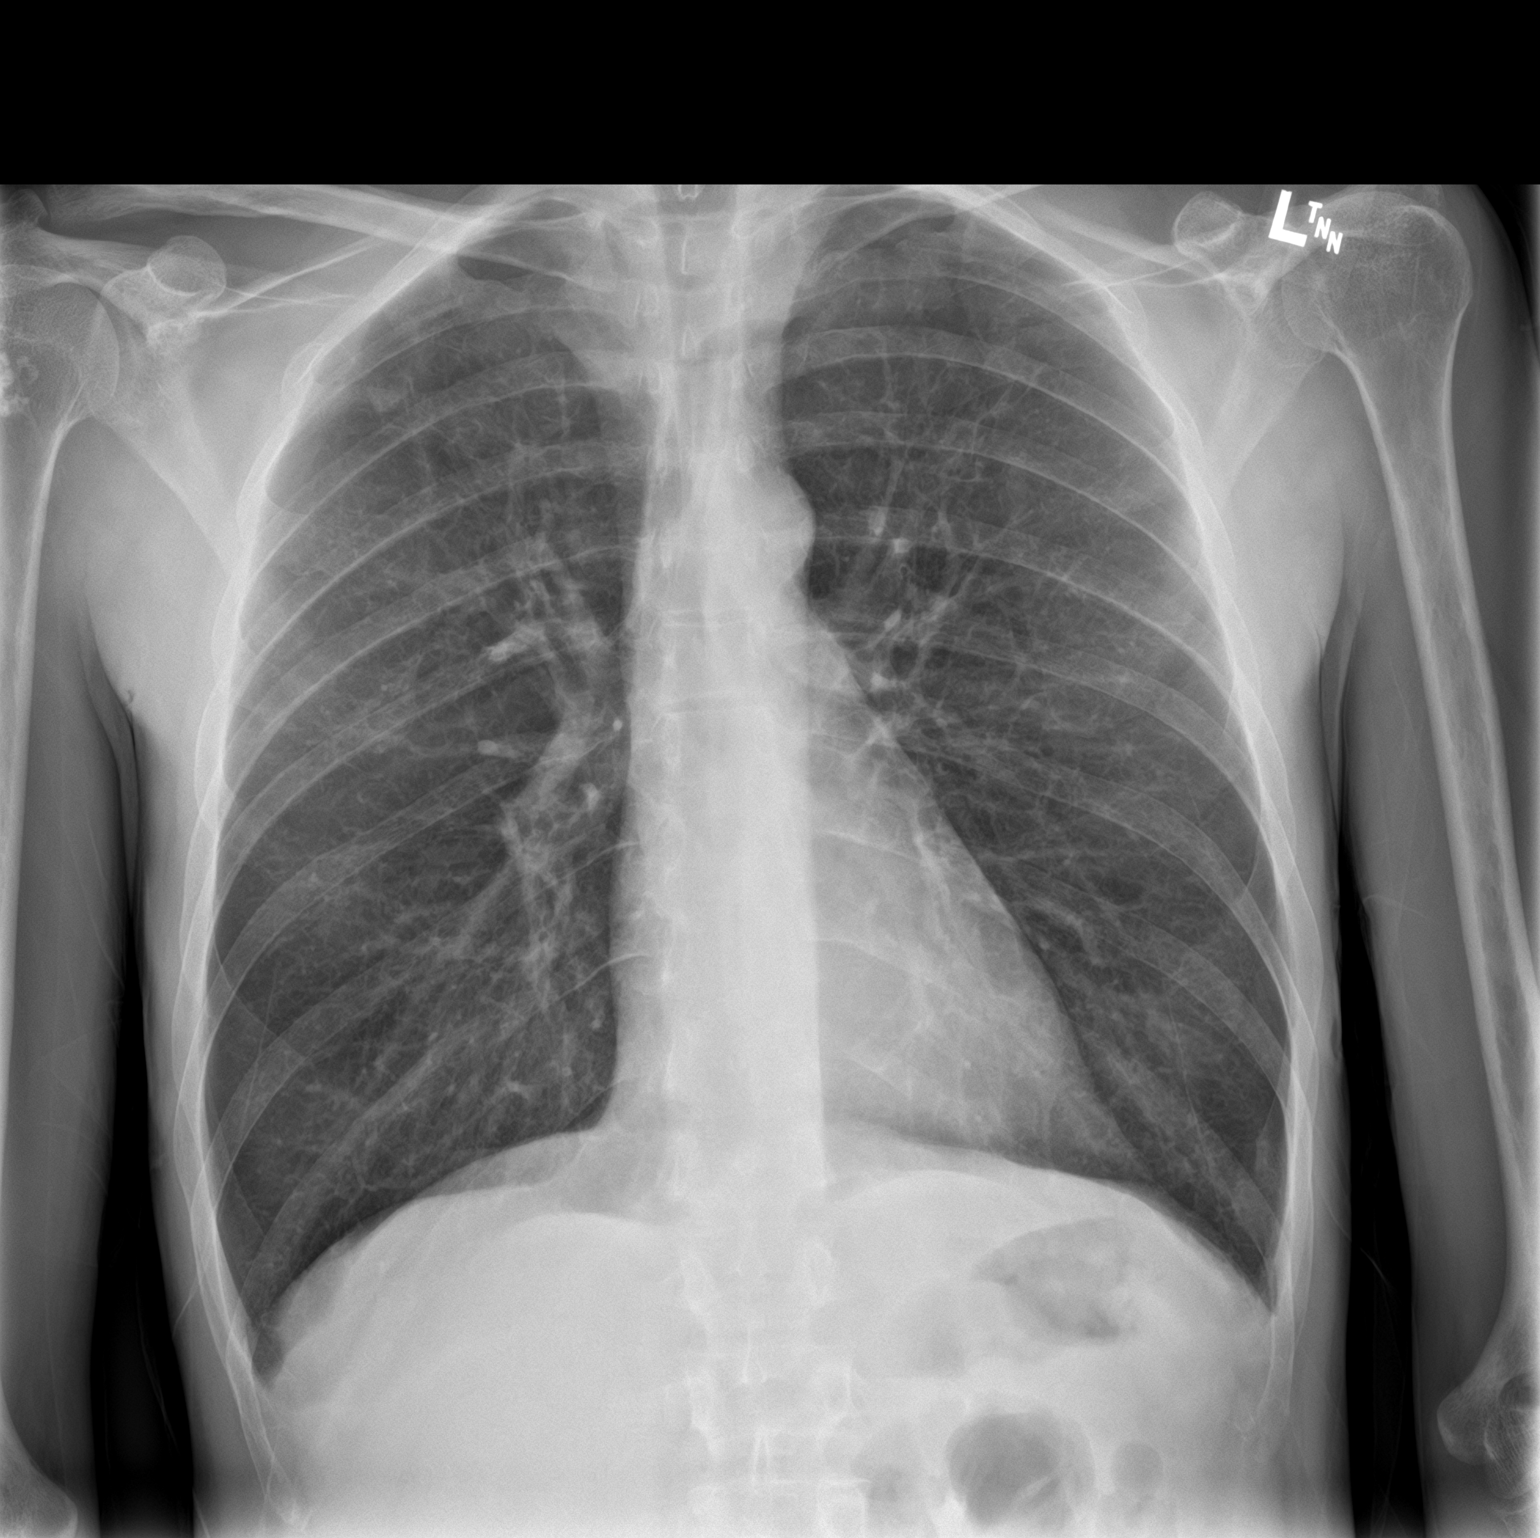

[chest lat]
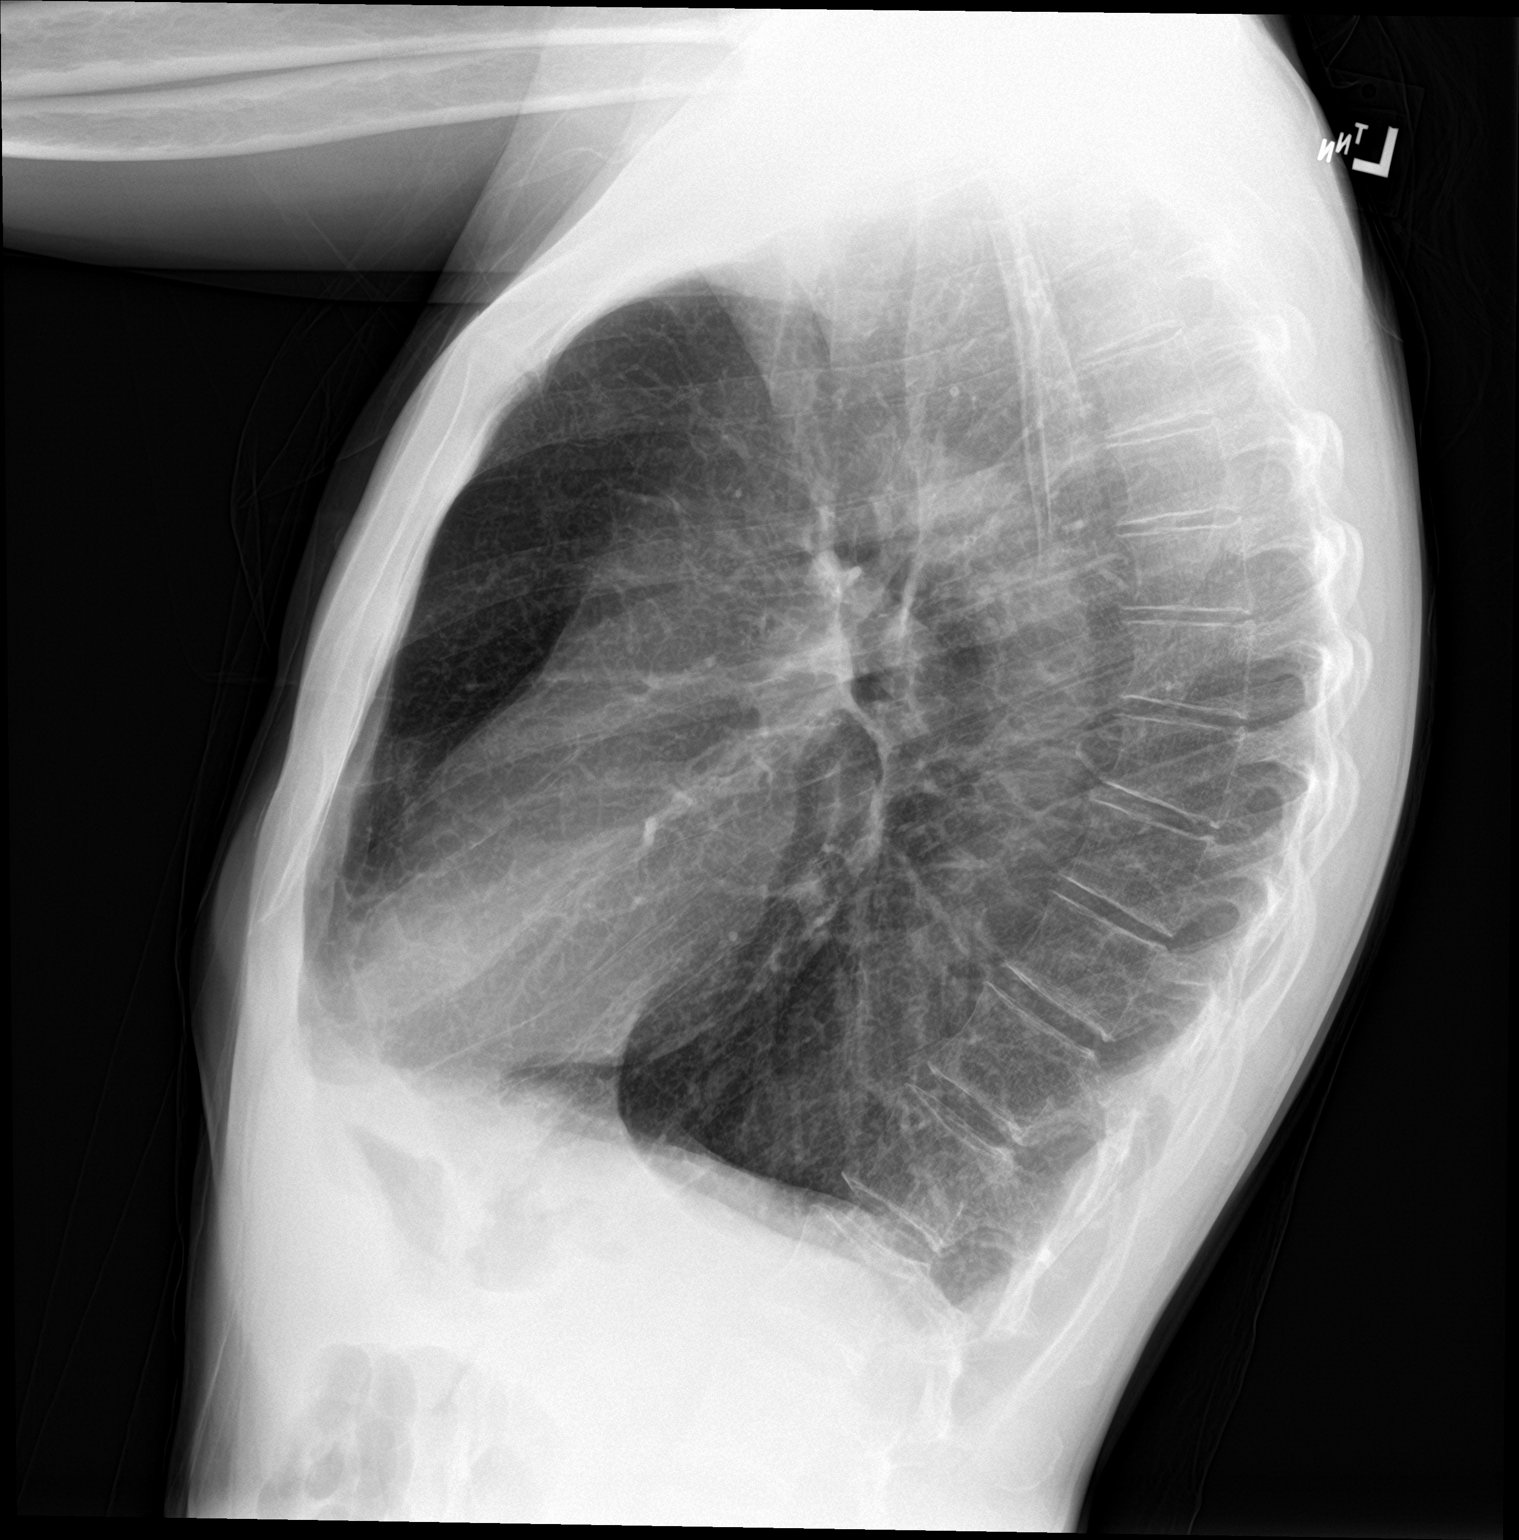

[2 of 2 positions shown; findings below may reference images not displayed]

FINDINGS: The previously noted cavitary nodule in the right upper lobe is
again noted, although the cavitary portion is not well seen
currently. This nodular opacity currently measures 1.1 x 0.9 cm.
There is underlying scattered scarring and hyperexpansion. No new
airspace opacity. Heart size and pulmonary vascularity are normal.
No adenopathy. Sclerotic focus in the proximal right humerus again
noted.
IMPRESSION: Nodular opacity in the right upper lobe is again noted with less
cavitation the slightly more overall opacity compared to recent
study. Underlying hyperexpansion and areas of scarring with probable
degree of underlying emphysema. No airspace consolidation. Stable
cardiac silhouette. No evident adenopathy. Question bone infarct
versus enchondroma proximal right humerus.

## 2021-03-05 ENCOUNTER — Emergency Department
Admission: EM | Admit: 2021-03-05 | Discharge: 2021-03-05 | Disposition: A | Discharge status: Home / Self Care | Payer: Self-pay | Attending: Emergency Medicine | Admitting: Emergency Medicine

## 2021-03-05 ENCOUNTER — Other Ambulatory Visit: Payer: Self-pay

## 2021-03-05 ENCOUNTER — Encounter: Payer: Self-pay | Admitting: Emergency Medicine

## 2021-03-05 ENCOUNTER — Emergency Department: Payer: Self-pay

## 2021-03-05 DIAGNOSIS — Z77098 Contact with and (suspected) exposure to other hazardous, chiefly nonmedicinal, chemicals: Secondary | ICD-10-CM

## 2021-03-05 DIAGNOSIS — T5891XA Toxic effect of carbon monoxide from unspecified source, accidental (unintentional), initial encounter: Secondary | ICD-10-CM | POA: Insufficient documentation

## 2021-03-05 DIAGNOSIS — F17210 Nicotine dependence, cigarettes, uncomplicated: Secondary | ICD-10-CM | POA: Insufficient documentation

## 2021-03-05 DIAGNOSIS — R000 Tachycardia, unspecified: Secondary | ICD-10-CM | POA: Insufficient documentation

## 2021-03-05 DIAGNOSIS — R112 Nausea with vomiting, unspecified: Secondary | ICD-10-CM | POA: Insufficient documentation

## 2021-03-05 LAB — COMPREHENSIVE METABOLIC PANEL
ALT: 23 U/L (ref 0–44)
AST: 46 U/L — ABNORMAL HIGH (ref 15–41)
Albumin: 4 g/dL (ref 3.5–5.0)
Alkaline Phosphatase: 76 U/L (ref 38–126)
Anion gap: 12 (ref 5–15)
BUN: 7 mg/dL (ref 6–20)
CO2: 25 mmol/L (ref 22–32)
Calcium: 8.8 mg/dL — ABNORMAL LOW (ref 8.9–10.3)
Chloride: 103 mmol/L (ref 98–111)
Creatinine, Ser: 0.66 mg/dL (ref 0.61–1.24)
GFR, Estimated: >60 mL/min (ref >60)
Glucose, Bld: 134 mg/dL — ABNORMAL HIGH (ref 70–99)
Potassium: 3.7 mmol/L (ref 3.5–5.1)
Sodium: 140 mmol/L (ref 135–145)
Total Bilirubin: 2.2 mg/dL — ABNORMAL HIGH (ref 0.3–1.2)
Total Protein: 6.9 g/dL (ref 6.5–8.1)

## 2021-03-05 LAB — CBC WITH DIFFERENTIAL/PLATELET
Abs Immature Granulocytes: 0.01 10*3/uL (ref 0.00–0.07)
Basophils Absolute: 0 10*3/uL (ref 0.0–0.1)
Basophils Relative: 1 %
Eosinophils Absolute: 0 10*3/uL (ref 0.0–0.5)
Eosinophils Relative: 0 %
HCT: 39 % (ref 39.0–52.0)
Hemoglobin: 13.7 g/dL (ref 13.0–17.0)
Immature Granulocytes: 0 %
Lymphocytes Relative: 21 %
Lymphs Abs: 1.2 10*3/uL (ref 0.7–4.0)
MCH: 31.6 pg (ref 26.0–34.0)
MCHC: 35.1 g/dL (ref 30.0–36.0)
MCV: 89.9 fL (ref 80.0–100.0)
Monocytes Absolute: 0.4 10*3/uL (ref 0.1–1.0)
Monocytes Relative: 7 %
Neutro Abs: 4 10*3/uL (ref 1.7–7.7)
Neutrophils Relative %: 71 %
Platelets: 193 10*3/uL (ref 150–400)
RBC: 4.34 MIL/uL (ref 4.22–5.81)
RDW: 15.3 % (ref 11.5–15.5)
WBC: 5.6 10*3/uL (ref 4.0–10.5)
nRBC: 0 % (ref 0.0–0.2)

## 2021-03-05 LAB — LACTIC ACID, PLASMA
Lactic Acid, Venous: 3.1 mmol/L (ref 0.5–1.9)
Lactic Acid, Venous: 1 mmol/L (ref 0.5–1.9)

## 2021-03-05 LAB — BLOOD GAS, VENOUS
Acid-Base Excess: 2.9 mmol/L — ABNORMAL HIGH (ref 0.0–2.0)
Bicarbonate: 24.8 mmol/L (ref 20.0–28.0)
O2 Saturation: 74.8 %
Patient temperature: 37
pCO2, Ven: 29 mmHg — ABNORMAL LOW (ref 44.0–60.0)
pH, Ven: 7.54 — ABNORMAL HIGH (ref 7.250–7.430)
pO2, Ven: 34 mmHg (ref 32.0–45.0)

## 2021-03-05 LAB — COOXEMETRY PANEL
Carboxyhemoglobin: 2.7 % — ABNORMAL HIGH (ref 0.5–1.5)
Methemoglobin: 0.4 % (ref 0.0–1.5)
O2 Saturation: 74 %
Total oxygen content: 71.7 mL/dL

## 2021-03-05 LAB — TROPONIN I (HIGH SENSITIVITY)
Troponin I (High Sensitivity): 4 ng/L (ref <18)
Troponin I (High Sensitivity): 5 ng/L (ref <18)

## 2021-03-05 LAB — LIPASE, BLOOD: Lipase: 26 U/L (ref 11–51)

## 2021-03-05 MED ORDER — SODIUM CHLORIDE 0.9 % IV BOLUS
1000.0000 mL | Freq: Once | INTRAVENOUS | Status: AC
  Administered 2021-03-05: 1000 mL via INTRAVENOUS

## 2021-03-05 MED ORDER — NICOTINE 14 MG/24HR TD PT24
14.0000 mg | MEDICATED_PATCH | Freq: Once | TRANSDERMAL | Status: DC
  Administered 2021-03-05: 14 mg via TRANSDERMAL
  Filled 2021-03-05: qty 1

## 2021-03-05 MED ORDER — ONDANSETRON HCL 4 MG/2ML IJ SOLN
4.0000 mg | Freq: Once | INTRAMUSCULAR | Status: AC
  Administered 2021-03-05: 4 mg via INTRAVENOUS
  Filled 2021-03-05: qty 2

## 2021-03-05 MED ORDER — DIAZEPAM 5 MG/ML IJ SOLN: 10.0000 mg | Freq: Once | INTRAMUSCULAR | Status: DC

## 2021-03-05 NOTE — ED Triage Notes (Signed)
Pt was driving to work, in company car, heard the [employer] pop and continued to drive with windows up for 45 minutes. He made it to work an felt shaky and vomited. Fire Department got a positive reading for CO2 in his vehicle.

## 2021-03-05 NOTE — Discharge Instructions (Signed)
I'd recommend that you have your automobile inspected for possible carbon monoxide leaks or other damage  Do not drive until the car has been cleared  Avoid smoking in confined spaces

## 2021-03-05 NOTE — ED Provider Notes (Signed)
Briefly, 45 yo M here with questionable carbon monoxide exposure in car, though pt was driving. Had recent work done. Carboxyhgb is only 2.7. Labs reassuring. LA elevated but suspect this is 2/2 dehydration and reported h/o etoh use/liver disease. HDS. No fever or signs of sepsis. Plan to repeat LA after fluids, d/c.  LA normal, trop negative. D/c home.   Shaune Pollack, MD 03/05/21 [phone]

## 2021-03-05 NOTE — ED Provider Notes (Signed)
[facility]  ____________________________________________   Event Date/Time   First MD Initiated Contact with Patient 03/05/21 1546     (approximate)  I have reviewed the triage vital signs and the nursing notes.   HISTORY  Chief Complaint Toxic Inhalation    HPI Shaun Ward is a 45 y.o. male with past medical history of emphysema, alcohol use disorder who presents with concern for carbon monoxide poisoning.  Patient was driving in his car to work about 25 minutes into the drive heard his muscular making abnormal noises he then developed nausea vomiting headache and lightheadedness.  When fire rescue got to him there carbon oxide detector did alarm.  At the time of my evaluation patient is feeling significantly improved.  He no longer has headache does continue to have some nausea.  He denies chest pain shortness of breath or abdominal pain.  Does not have any generators or any other appliances at home that could cause car monoxide exposure.         Past Medical History:  Diagnosis Date   Alcohol abuse    Emphysema lung (HCC)    Polysubstance abuse (HCC)    Tobacco abuse     Patient Active Problem List   Diagnosis Date Noted   Alcohol withdrawal (HCC) 06/17/2019   Fall at home, initial encounter 06/16/2019   Pelvic fracture (HCC) 06/16/2019   Fall 06/16/2019   Tobacco abuse    Alcohol abuse    Polysubstance abuse (HCC)    Emphysema lung (HCC)    Alcohol withdrawal syndrome without complication (HCC)    Hypokalemia    Thrombocytopenia (HCC)    Protein-calorie malnutrition, severe 05/05/2018   Pressure injury of skin 05/04/2018   Pneumonia 05/03/2018    Past Surgical History:  Procedure Laterality Date   HERNIA REPAIR      Prior to Admission medications   Medication Sig Start Date End Date Taking? Authorizing Provider  diphenhydramine-acetaminophen (TYLENOL PM) 25-500 MG TABS tablet Take 1 tablet by mouth at bedtime as needed.     [provider]    Allergies Patient has no known allergies.  Family History  Problem Relation Age of Onset   Diabetes Father    CAD Paternal Grandmother     Social History Social History   Tobacco Use   Smoking status: Every Day    Packs/day: 1.00    Types: Cigarettes   Smokeless tobacco: Never  Substance Use Topics   Alcohol use: Yes    Alcohol/week: 6.0 - 12.0 standard drinks    Types: 6 - 12 Cans of beer per week    Comment: Quit 7 days ago   Drug use: Yes    Types: Marijuana    Review of Systems   Review of Systems  Constitutional:  Negative for chills and fever.  Respiratory:  Negative for chest tightness and shortness of breath.   Cardiovascular:  Negative for chest pain.  Gastrointestinal:  Positive for nausea and vomiting. Negative for abdominal pain.  Neurological:  Positive for light-headedness and headaches.  All other systems reviewed and are negative.  Physical Exam Updated Vital Signs BP (!) 144/96   Pulse 75   Temp 98.2 F (36.8 C) (Oral)   Resp 20   Ht 5\' 6"  (1.676 m)   Wt 54.4 kg   SpO2 96%   BMI 19.37 kg/m   Physical Exam Vitals and nursing note reviewed.  Constitutional:      General: He is not in acute  distress.    Appearance: Normal appearance.  HENT:     Head: Normocephalic and atraumatic.  Eyes:     General: No scleral icterus.    Conjunctiva/sclera: Conjunctivae normal.  Pulmonary:     Effort: Pulmonary effort is normal. No respiratory distress.     Breath sounds: Normal breath sounds. No wheezing.  Abdominal:     General: Abdomen is flat. There is no distension.     Palpations: Abdomen is soft.     Tenderness: There is no abdominal tenderness.  Musculoskeletal:        General: No deformity or signs of injury.     Cervical back: Normal range of motion.  Skin:    Coloration: Skin is not jaundiced or pale.  Neurological:     General: No focal deficit present.     Mental Status: He is alert and oriented to  person, place, and time. Mental status is at baseline.     Comments: Patient is mildly tremulous  Psychiatric:        Mood and Affect: Mood normal.        Behavior: Behavior normal.     LABS (all labs ordered are listed, but only abnormal results are displayed)  Labs Reviewed  BLOOD GAS, VENOUS - Abnormal; Notable for the following components:      Result Value   pH, Ven 7.54 (*)    pCO2, Ven 29 (*)    Acid-Base Excess 2.9 (*)    All other components within normal limits  COMPREHENSIVE METABOLIC PANEL - Abnormal; Notable for the following components:   Glucose, Bld 134 (*)    Calcium 8.8 (*)    AST 46 (*)    Total Bilirubin 2.2 (*)    All other components within normal limits  LACTIC ACID, PLASMA - Abnormal; Notable for the following components:   Lactic Acid, Venous 3.1 (*)    All other components within normal limits  COOXEMETRY PANEL - Abnormal; Notable for the following components:   Carboxyhemoglobin 2.7 (*)    All other components within normal limits  CBC WITH DIFFERENTIAL/PLATELET  LIPASE, BLOOD  LACTIC ACID, PLASMA  TROPONIN I (HIGH SENSITIVITY)  TROPONIN I (HIGH SENSITIVITY)   ____________________________________________  EKG  Sinus tachycardia, ACs, normal axis, normal intervals, no acute ischemic changes ____________________________________________  RADIOLOGY I, Randol Kern, personally viewed and evaluated these images (plain radiographs) as part of my medical decision making, as well as reviewing the written report by the radiologist.  ED MD interpretation:  I reviewed the CXR which does not show any acute cardiopulmonary process      ____________________________________________   PROCEDURES  Procedure(s) performed (including Critical Care):  Procedures   ____________________________________________   INITIAL IMPRESSION / ASSESSMENT AND PLAN / ED COURSE     Patient is a 45 year old male with history of alcohol use and tobacco use  disorder who presents with a concern for carbon monoxide exposure.  He had an episode of headache lightheadedness nausea and vomiting while driving in his car after he heard the muffler make an abnormal sound.  Car monoxide detector with fire rescue and off however with EMS carbon monoxide detector did not alarm.  On initial evaluation he is tachycardic and somewhat tremulous but he is saturating fine and overall appears well.  Headache is now resolved although he does still have some ongoing nausea.  Denies chest pain or abdominal pain.  Abdomen is soft nontender on exam.  We will check a CO level as well  as troponin and lactate to rule out endorgan damage, though with him driving in the car my suspicion for CO poisoning is low.  CO level is less than 5.  Patient's lactate is notably elevated at 3.1.  He does have a history of alcohol use disorder suspect he may have some underlying liver dysfunction which could be contributing.  The rest of his labs are reassuring.  Patient was given IV fluids and Zofran.  Pending a repeat lactate.  Overall patient looks well and is improving symptomatically.  Unclear what caused this episode.  His troponin is negative but will repeat to ensure this was not an episode of ischemia.  No longer has a headache so very low suspicion for intracranial pathology.  The time of signout he is pending a repeat lactate and repeat troponin.  Clinical Course as of 03/05/21 1612  Tue Mar 05, 2021  1358 Carboxyhemoglobin(!): 2.7 [KM]    Clinical Course User Index [KM] Georga Hacking, MD     ____________________________________________   FINAL CLINICAL IMPRESSION(S) / ED DIAGNOSES  Final diagnoses:  Accidental exposure to carbon monoxide  Nausea and vomiting, unspecified vomiting type     ED Discharge Orders     None        Note:  This document was prepared using Dragon voice recognition software and may include unintentional dictation errors.    Georga Hacking, MD 03/05/21 [phone]

## 2022-03-12 ENCOUNTER — Ambulatory Visit
Admission: EM | Admit: 2022-03-12 | Discharge: 2022-03-12 | Disposition: A | Discharge status: Home / Self Care | Payer: Self-pay | Attending: Emergency Medicine | Admitting: Emergency Medicine

## 2022-03-12 DIAGNOSIS — L02214 Cutaneous abscess of groin: Secondary | ICD-10-CM

## 2022-03-12 DIAGNOSIS — B356 Tinea cruris: Secondary | ICD-10-CM

## 2022-03-12 MED ORDER — CLOTRIMAZOLE 1 % EX CREA: TOPICAL_CREAM | CUTANEOUS | 0 refills | Status: DC

## 2022-03-12 MED ORDER — DOXYCYCLINE HYCLATE 100 MG PO CAPS: 100.0000 mg | ORAL_CAPSULE | Freq: Two times a day (BID) | ORAL | 0 refills | Status: DC

## 2022-03-12 MED ORDER — FLUCONAZOLE 150 MG PO TABS: 150.0000 mg | ORAL_TABLET | Freq: Every day | ORAL | 0 refills | Status: AC

## 2022-03-12 NOTE — ED Provider Notes (Signed)
[facility]    CSN: [id number] Arrival date & time: 03/12/22  1706      History   Chief Complaint Chief Complaint  Patient presents with   Groin Swelling    HPI Shaun Ward is a 46 y.o. male.   Patient presents for evaluation of blister to scrotum that began 3 weeks ago.  During this timeframe the area site has opened and drained but has not healed.  Denies fever.  Has been managing with topical antibiotic and A&E ointment.   Past Medical History:  Diagnosis Date   Alcohol abuse    Emphysema lung (HCC)    Polysubstance abuse (HCC)    Tobacco abuse     Patient Active Problem List   Diagnosis Date Noted   Alcohol withdrawal (HCC) 06/17/2019   Fall at home, initial encounter 06/16/2019   Pelvic fracture (HCC) 06/16/2019   Fall 06/16/2019   Tobacco abuse    Alcohol abuse    Polysubstance abuse (HCC)    Emphysema lung (HCC)    Alcohol withdrawal syndrome without complication (HCC)    Hypokalemia    Thrombocytopenia (HCC)    Protein-calorie malnutrition, severe 05/05/2018   Pressure injury of skin 05/04/2018   Pneumonia 05/03/2018    Past Surgical History:  Procedure Laterality Date   HERNIA REPAIR         Home Medications    Prior to Admission medications   Medication Sig Start Date End Date Taking? Authorizing Provider  clotrimazole (LOTRIMIN) 1 % cream Apply to affected area 2 times daily 03/12/22  Yes Baker Kogler R, NP  doxycycline (VIBRAMYCIN) 100 MG capsule Take 1 capsule (100 mg total) by mouth 2 (two) times daily. 03/12/22  Yes Sandara Tyree R, NP  fluconazole (DIFLUCAN) 150 MG tablet Take 1 tablet (150 mg total) by mouth daily for 2 doses. 03/12/22 03/14/22 Yes Alyvia Derk R, NP  diphenhydramine-acetaminophen (TYLENOL PM) 25-500 MG TABS tablet Take 1 tablet by mouth at bedtime as needed.    [provider]    Family History Family History  Problem Relation Age of Onset   Diabetes Father    CAD Paternal  Grandmother     Social History Social History   Tobacco Use   Smoking status: Every Day    Packs/day: 1.00    Types: Cigarettes   Smokeless tobacco: Never  Substance Use Topics   Alcohol use: Yes    Alcohol/week: 6.0 - 12.0 standard drinks of alcohol    Types: 6 - 12 Cans of beer per week    Comment: Quit 7 days ago   Drug use: Yes    Types: Marijuana     Allergies   Patient has no known allergies.   Review of Systems Review of Systems  Constitutional: Negative.   Respiratory: Negative.    Cardiovascular: Negative.   Skin:  Positive for wound. Negative for color change, pallor and rash.     Physical Exam Triage Vital Signs ED Triage Vitals  Enc Vitals Group     BP 03/12/22 1731 (S) (!) 132/106     Pulse Rate 03/12/22 1731 (S) (!) 114     Resp --      Temp 03/12/22 1731 98.3 F (36.8 C)     Temp Source 03/12/22 1731 Oral     SpO2 03/12/22 1731 (S) 100 %     Weight 03/12/22 1729 128 lb (58.1 kg)     Height 03/12/22 1729 5\' 6"  (1.676 m)  Head Circumference --      Peak Flow --      Pain Score 03/12/22 1729 7     Pain Loc --      Pain Edu? --      Excl. in GC? --    No data found.  Updated Vital Signs BP (S) (!) 132/106 (BP Location: Left Arm)   Pulse (S) (!) 114   Temp 98.3 F (36.8 C) (Oral)   Ht 5\' 6"  (1.676 m)   Wt 128 lb (58.1 kg)   SpO2 (S) 100%   BMI 20.66 kg/m   Visual Acuity Right Eye Distance:   Left Eye Distance:   Bilateral Distance:    Right Eye Near:   Left Eye Near:    Bilateral Near:     Physical Exam Constitutional:      Appearance: Normal appearance.  HENT:     Head: Normocephalic.  Eyes:     Extraocular Movements: Extraocular movements intact.  Pulmonary:     Effort: Pulmonary effort is normal.  Skin:    Comments: 1 x 1 cm abscess present to the left scrotum  Several erythematous immature abscess is present across the perineum without drainage  fungus is present to the bilateral medial aspect of the thigh   Neurological:     Mental Status: He is alert and oriented to person, place, and time. Mental status is at baseline.  Psychiatric:        Mood and Affect: Mood normal.        Behavior: Behavior normal.      UC Treatments / Results  Labs (all labs ordered are listed, but only abnormal results are displayed) Labs Reviewed - No data to display  EKG   Radiology No results found.  Procedures Procedures (including critical care time)  Medications Ordered in UC Medications - No data to display  Initial Impression / Assessment and Plan / UC Course  I have reviewed the triage vital signs and the nursing notes.  Pertinent labs & imaging results that were available during my care of the patient were reviewed by me and considered in my medical decision making (see chart for details).  Abscess  groin, jock itch  There are multiple abscesses present across the perineum, at this time they are firm and unable to be drained, discussed with patient, his site to the scrotum has already open will not attempt to incise today, doxycycline prescribed and recommended warm compresses over the affected areas to help soften tissue and to facilitate drainage, may use over-the-counter analgesics as needed and given strict precautions for nonhealing or nondraining site to return for reevaluation  Fungus is present to the bilateral thighs, due to extent Diflucan prescribed as well as clotrimazole cream to be used until cleared, advised to give area clean and dry to prevent further irritation, may follow-up with urgent care for reevaluation of site as needed Final Clinical Impressions(s) / UC Diagnoses   Final diagnoses:  Abscess, groin  Jock itch     Discharge Instructions      Today you are being treated for an abscess as well as fungus to urinary  Take doxycycline every morning and every evening for 10 days, this medicine is to treat his bacteria of the abscesses   Take Diflucan when you  receive your medicine then in 3 days take second dose then begin clotrimazole cream every morning and every evening until clear, ensure area stays dry and clean as fungus thrives in warm moist  dark environment  Hold warm-hot compresses to affected area at least 4 times a day, this helps to facilitate draining, the more the better   If The area becomes painful you may use over-the-counter Tylenol 500 mg every 6 hours and/or ibuprofen 600 to 800 mg every 6-8 hours  Please return for evaluation for increased swelling, increased tenderness or pain, non healing site, non draining site, you begin to have fever or chills   We reviewed the etiology of recurrent abscesses of skin.  Skin abscesses are collections of pus within the dermis and deeper skin tissues. Skin abscesses manifest as painful, tender, fluctuant, and erythematous nodules, frequently surmounted by a pustule and surrounded by a rim of erythematous swelling.  Spontaneous drainage of purulent material may occur.  Fever can occur on occasion.    -Skin abscesses can develop in healthy individuals with no predisposing conditions other than skin or nasal carriage of Staphylococcus aureus.  Individuals in close contact with others who have active infection with skin abscesses are at increased risk which is likely to explain why twin brother has similar episodes.   In addition, any process leading to a breach in the skin barrier can also predispose to the development of a skin abscesses, such as atopic dermatitis.        ED Prescriptions     Medication Sig Dispense Auth. Provider   doxycycline (VIBRAMYCIN) 100 MG capsule Take 1 capsule (100 mg total) by mouth 2 (two) times daily. 20 capsule Yazmyn Valbuena R, NP   clotrimazole (LOTRIMIN) 1 % cream Apply to affected area 2 times daily 15 g Carles Florea R, NP   fluconazole (DIFLUCAN) 150 MG tablet Take 1 tablet (150 mg total) by mouth daily for 2 doses. 2 tablet Valinda Hoar, NP       PDMP not reviewed this encounter.   Valinda Hoar, [place] 03/12/22 [phone]

## 2022-03-12 NOTE — ED Triage Notes (Signed)
Patient reports that he has "some sort of infection" in his groin area. Patient reports that there is also a blister on his scrotum that recently popped and now "there is a hole there" -- symptoms started about 3 weeks ago.

## 2022-03-12 NOTE — Discharge Instructions (Addendum)
Today you are being treated for an abscess as well as fungus to urinary  Take doxycycline every morning and every evening for 10 days, this medicine is to treat his bacteria of the abscesses   Take Diflucan when you receive your medicine then in 3 days take second dose then begin clotrimazole cream every morning and every evening until clear, ensure area stays dry and clean as fungus thrives in warm moist dark environment  Hold warm-hot compresses to affected area at least 4 times a day, this helps to facilitate draining, the more the better   If The area becomes painful you may use over-the-counter Tylenol 500 mg every 6 hours and/or ibuprofen 600 to 800 mg every 6-8 hours  Please return for evaluation for increased swelling, increased tenderness or pain, non healing site, non draining site, you begin to have fever or chills   We reviewed the etiology of recurrent abscesses of skin.  Skin abscesses are collections of pus within the dermis and deeper skin tissues. Skin abscesses manifest as painful, tender, fluctuant, and erythematous nodules, frequently surmounted by a pustule and surrounded by a rim of erythematous swelling.  Spontaneous drainage of purulent material may occur.  Fever can occur on occasion.    -Skin abscesses can develop in healthy individuals with no predisposing conditions other than skin or nasal carriage of Staphylococcus aureus.  Individuals in close contact with others who have active infection with skin abscesses are at increased risk which is likely to explain why twin brother has similar episodes.   In addition, any process leading to a breach in the skin barrier can also predispose to the development of a skin abscesses, such as atopic dermatitis.

## 2022-06-10 ENCOUNTER — Ambulatory Visit
Admission: EM | Admit: 2022-06-10 | Discharge: 2022-06-10 | Disposition: A | Discharge status: Home / Self Care | Payer: 59 | Attending: Emergency Medicine | Admitting: Emergency Medicine

## 2022-06-10 ENCOUNTER — Other Ambulatory Visit: Payer: Self-pay

## 2022-06-10 DIAGNOSIS — N492 Inflammatory disorders of scrotum: Secondary | ICD-10-CM

## 2022-06-10 MED ORDER — DOXYCYCLINE HYCLATE 100 MG PO CAPS: 100.0000 mg | ORAL_CAPSULE | Freq: Two times a day (BID) | ORAL | 0 refills | Status: DC

## 2022-06-10 MED ORDER — MUPIROCIN CALCIUM 2 % EX CREA: 1.0000 | TOPICAL_CREAM | Freq: Every day | CUTANEOUS | 1 refills | Status: DC

## 2022-06-10 NOTE — ED Provider Notes (Signed)
[facility]    CSN: [id number] Arrival date & time: 06/10/22  0909      History   Chief Complaint No chief complaint on file.   HPI Shaun Ward is a 47 y.o. male.   Patient presents for evaluation of abscesses present to the perineum for approximately 6 weeks.  Has abscess to the scrotum that has become painful over the last 1 to 2 days, began draining this morning, open spontaneously.  Endorses that he was seen here in November, treated with antibiotics and only saw minimal improvement.  Endorses abscess has increased in size, believes it to be the size of a grape or ping-pong ball.  Denies fevers.    Past Medical History:  Diagnosis Date   Alcohol abuse    Emphysema lung ([place])    Polysubstance abuse ([place])    Tobacco abuse     Patient Active Problem List   Diagnosis Date Noted   Alcohol withdrawal ([place]) 06/17/2019   Fall at home, initial encounter 06/16/2019   Pelvic fracture ([place]) 06/16/2019   Fall 06/16/2019   Tobacco abuse    Alcohol abuse    Polysubstance abuse ([place])    Emphysema lung ([place])    Alcohol withdrawal syndrome without complication (HCC)    Hypokalemia    Thrombocytopenia ([place])    Protein-calorie malnutrition, severe 05/05/2018   Pressure injury of skin 05/04/2018   Pneumonia 05/03/2018    Past Surgical History:  Procedure Laterality Date   HERNIA REPAIR         Home Medications    Prior to Admission medications   Medication Sig Start Date End Date Taking? Authorizing Provider  diphenhydramine-acetaminophen (TYLENOL PM) 25-500 MG TABS tablet Take 1 tablet by mouth at bedtime as needed.    [provider]    Family History Family History  Problem Relation Age of Onset   CAD Paternal Grandmother     Social History Social History   Tobacco Use   Smoking status: Every Day    Packs/day: 1.00    Types: Cigarettes   Smokeless tobacco: Never  Substance Use Topics   Alcohol use: Yes    Alcohol/week: 6.0 - 12.0  standard drinks of alcohol    Types: 6 - 12 Cans of beer per week    Comment: Quit 7 days ago   Drug use: Yes     Allergies   Patient has no known allergies.   Review of Systems Review of Systems   Physical Exam Triage Vital Signs ED Triage Vitals [06/10/22 0956]  Enc Vitals Group     BP (!) 154/101     Pulse Rate 98     Resp 18     Temp 99.4 F (37.4 C)     Temp src      SpO2      Weight      Height      Head Circumference      Peak Flow      Pain Score      Pain Loc      Pain Edu?      Excl. in [place]?    No data found.  Updated Vital Signs BP (!) 154/101   Pulse 98   Temp 99.4 F (37.4 C)   Resp 18   Visual Acuity Right Eye Distance:   Left Eye Distance:   Bilateral Distance:    Right Eye Near:   Left Eye Near:    Bilateral Near:  Physical Exam Constitutional:      Appearance: Normal appearance.  Eyes:     Extraocular Movements: Extraocular movements intact.  Pulmonary:     Effort: Pulmonary effort is normal.  Genitourinary:      Comments: 2 x 3 abscess present central to the scrotum, bloody drainage present to the center, nontender, nonerythematous  Scattered 0.5 x 0.5 nodules present for the perineum within the hair  Neurological:     Mental Status: He is alert and oriented to person, place, and time. Mental status is at baseline.      UC Treatments / Results  Labs (all labs ordered are listed, but only abnormal results are displayed) Labs Reviewed - No data to display  EKG   Radiology No results found.  Procedures Procedures (including critical care time)  Medications Ordered in UC Medications - No data to display  Initial Impression / Assessment and Plan / UC Course  I have reviewed the triage vital signs and the nursing notes.  Pertinent labs & imaging results that were available during my care of the patient were reviewed by me and considered in my medical decision making (see chart for details).  Scrotal  Abscess  As site has already begun to drain will not incise, discussed with patient 10-day course prescribed as well as Bactrim began topical cream to be applied to the perineal, recommended warm compresses over the affected area and given strict precautions for nonhealing nondraining site to follow-up if no improvement seen after completion, referral given to dermatology and general surgery for further evaluation and management Final Clinical Impressions(s) / UC Diagnoses   Final diagnoses:  None   Discharge Instructions   None    ED Prescriptions   None    PDMP not reviewed this encounter.   Hans Eden, NP 06/10/22 1044

## 2022-06-10 NOTE — Discharge Instructions (Addendum)
Take doxycycline every morning and every evening for 10 days  May prophylactically after been cream over the perineum area where hair loss to ideally prevent remaining abscesses from becoming infected  Hold warm-hot compresses to affected area at least 4 times a day, this helps to facilitate draining, the more the better  Please return for evaluation for increased swelling, increased tenderness or pain, non healing site, non draining site, you begin to have fever or chills   -Skin abscesses can develop in healthy individuals with no predisposing conditions other than skin or nasal carriage of Staphylococcus aureus.  Individuals in close contact with others who have active infection with skin abscesses are at increased risk which is likely to explain why twin brother has similar episodes.   In addition, any process leading to a breach in the skin barrier can also predispose to the development of a skin abscesses, such as atopic dermatitis.     You have been given information to to dermatologist offices, attempt to get appointment, may take a few months to be seen, also call around to additional local offices for evaluation as well

## 2022-06-10 NOTE — ED Triage Notes (Signed)
Pt states he has been experiencing multiple abscesses to groin area for the last 6 wks and they continue to get worse after abx.

## 2022-12-26 ENCOUNTER — Observation Stay (HOSPITAL_COMMUNITY)
Admission: EM | Admit: 2022-12-26 | Discharge: 2022-12-28 | Disposition: A | Discharge status: Home / Self Care | Payer: 59 | Source: Ambulatory Visit | Attending: Internal Medicine | Admitting: Internal Medicine

## 2022-12-26 ENCOUNTER — Other Ambulatory Visit: Payer: Self-pay

## 2022-12-26 ENCOUNTER — Encounter (HOSPITAL_COMMUNITY): Payer: Self-pay | Admitting: Emergency Medicine

## 2022-12-26 ENCOUNTER — Emergency Department (HOSPITAL_COMMUNITY): Payer: 59

## 2022-12-26 DIAGNOSIS — Z79899 Other long term (current) drug therapy: Secondary | ICD-10-CM | POA: Insufficient documentation

## 2022-12-26 DIAGNOSIS — F10239 Alcohol dependence with withdrawal, unspecified: Principal | ICD-10-CM | POA: Insufficient documentation

## 2022-12-26 DIAGNOSIS — R17 Unspecified jaundice: Secondary | ICD-10-CM | POA: Diagnosis not present

## 2022-12-26 DIAGNOSIS — E874 Mixed disorder of acid-base balance: Secondary | ICD-10-CM | POA: Diagnosis present

## 2022-12-26 DIAGNOSIS — D539 Nutritional anemia, unspecified: Secondary | ICD-10-CM | POA: Diagnosis present

## 2022-12-26 DIAGNOSIS — Z720 Tobacco use: Secondary | ICD-10-CM | POA: Insufficient documentation

## 2022-12-26 DIAGNOSIS — F10230 Alcohol dependence with withdrawal, uncomplicated: Principal | ICD-10-CM | POA: Diagnosis present

## 2022-12-26 DIAGNOSIS — E8729 Other acidosis: Secondary | ICD-10-CM | POA: Diagnosis not present

## 2022-12-26 DIAGNOSIS — J439 Emphysema, unspecified: Secondary | ICD-10-CM | POA: Diagnosis present

## 2022-12-26 DIAGNOSIS — E876 Hypokalemia: Secondary | ICD-10-CM | POA: Diagnosis not present

## 2022-12-26 DIAGNOSIS — R7989 Other specified abnormal findings of blood chemistry: Secondary | ICD-10-CM | POA: Diagnosis not present

## 2022-12-26 DIAGNOSIS — F10939 Alcohol use, unspecified with withdrawal, unspecified: Secondary | ICD-10-CM | POA: Diagnosis present

## 2022-12-26 DIAGNOSIS — F419 Anxiety disorder, unspecified: Secondary | ICD-10-CM | POA: Diagnosis present

## 2022-12-26 DIAGNOSIS — Z8249 Family history of ischemic heart disease and other diseases of the circulatory system: Secondary | ICD-10-CM

## 2022-12-26 DIAGNOSIS — Z1152 Encounter for screening for COVID-19: Secondary | ICD-10-CM

## 2022-12-26 DIAGNOSIS — F10930 Alcohol use, unspecified with withdrawal, uncomplicated: Principal | ICD-10-CM

## 2022-12-26 DIAGNOSIS — E861 Hypovolemia: Secondary | ICD-10-CM | POA: Diagnosis present

## 2022-12-26 DIAGNOSIS — F17210 Nicotine dependence, cigarettes, uncomplicated: Secondary | ICD-10-CM | POA: Diagnosis present

## 2022-12-26 DIAGNOSIS — R0602 Shortness of breath: Secondary | ICD-10-CM | POA: Diagnosis present

## 2022-12-26 DIAGNOSIS — E538 Deficiency of other specified B group vitamins: Secondary | ICD-10-CM | POA: Diagnosis present

## 2022-12-26 DIAGNOSIS — E871 Hypo-osmolality and hyponatremia: Secondary | ICD-10-CM | POA: Diagnosis not present

## 2022-12-26 LAB — BLOOD GAS, VENOUS
Acid-base deficit: 3.2 mmol/L — ABNORMAL HIGH (ref 0.0–2.0)
Bicarbonate: 19.7 mmol/L — ABNORMAL LOW (ref 20.0–28.0)
O2 Saturation: 96 %
Patient temperature: 37
pCO2, Ven: 29 mmHg — ABNORMAL LOW (ref 44–60)
pH, Ven: 7.44 — ABNORMAL HIGH (ref 7.25–7.43)
pO2, Ven: 75 mmHg — ABNORMAL HIGH (ref 32–45)

## 2022-12-26 LAB — RAPID URINE DRUG SCREEN, HOSP PERFORMED
Amphetamines: NOT DETECTED
Barbiturates: NOT DETECTED
Benzodiazepines: POSITIVE — AB
Cocaine: NOT DETECTED
Opiates: NOT DETECTED
Tetrahydrocannabinol: POSITIVE — AB

## 2022-12-26 LAB — CBC
HCT: 37.8 % — ABNORMAL LOW (ref 39.0–52.0)
Hemoglobin: 13.4 g/dL (ref 13.0–17.0)
MCH: 34.4 pg — ABNORMAL HIGH (ref 26.0–34.0)
MCHC: 35.4 g/dL (ref 30.0–36.0)
MCV: 97.2 fL (ref 80.0–100.0)
Platelets: 193 10*3/uL (ref 150–400)
RBC: 3.89 MIL/uL — ABNORMAL LOW (ref 4.22–5.81)
RDW: 17.3 % — ABNORMAL HIGH (ref 11.5–15.5)
WBC: 12.6 10*3/uL — ABNORMAL HIGH (ref 4.0–10.5)
nRBC: 0.2 % (ref 0.0–0.2)

## 2022-12-26 LAB — BASIC METABOLIC PANEL
Anion gap: 13 (ref 5–15)
BUN: 6 mg/dL (ref 6–20)
CO2: 26 mmol/L (ref 22–32)
Calcium: 7.7 mg/dL — ABNORMAL LOW (ref 8.9–10.3)
Chloride: 90 mmol/L — ABNORMAL LOW (ref 98–111)
Creatinine, Ser: 0.99 mg/dL (ref 0.61–1.24)
GFR, Estimated: >60 mL/min (ref >60)
Glucose, Bld: 141 mg/dL — ABNORMAL HIGH (ref 70–99)
Potassium: 3.6 mmol/L (ref 3.5–5.1)
Sodium: 129 mmol/L — ABNORMAL LOW (ref 135–145)

## 2022-12-26 LAB — SARS CORONAVIRUS 2 BY RT PCR: SARS Coronavirus 2 by RT PCR: NEGATIVE

## 2022-12-26 LAB — COMPREHENSIVE METABOLIC PANEL
ALT: 33 U/L (ref 0–44)
AST: 64 U/L — ABNORMAL HIGH (ref 15–41)
Albumin: 3 g/dL — ABNORMAL LOW (ref 3.5–5.0)
Alkaline Phosphatase: 135 U/L — ABNORMAL HIGH (ref 38–126)
Anion gap: 27 — ABNORMAL HIGH (ref 5–15)
BUN: 6 mg/dL (ref 6–20)
CO2: 13 mmol/L — ABNORMAL LOW (ref 22–32)
Calcium: 7.7 mg/dL — ABNORMAL LOW (ref 8.9–10.3)
Chloride: 94 mmol/L — ABNORMAL LOW (ref 98–111)
Creatinine, Ser: 0.93 mg/dL (ref 0.61–1.24)
GFR, Estimated: >60 mL/min (ref >60)
Glucose, Bld: 107 mg/dL — ABNORMAL HIGH (ref 70–99)
Potassium: 2.8 mmol/L — ABNORMAL LOW (ref 3.5–5.1)
Sodium: 134 mmol/L — ABNORMAL LOW (ref 135–145)
Total Bilirubin: 3.1 mg/dL — ABNORMAL HIGH (ref 0.3–1.2)
Total Protein: 6 g/dL — ABNORMAL LOW (ref 6.5–8.1)

## 2022-12-26 LAB — LACTIC ACID, PLASMA: Lactic Acid, Venous: 5.3 mmol/L (ref 0.5–1.9)

## 2022-12-26 LAB — I-STAT CG4 LACTIC ACID, ED
Lactic Acid, Venous: 14.5 mmol/L (ref 0.5–1.9)
Lactic Acid, Venous: 9.7 mmol/L (ref 0.5–1.9)

## 2022-12-26 LAB — HIV ANTIBODY (ROUTINE TESTING W REFLEX): HIV Screen 4th Generation wRfx: NONREACTIVE

## 2022-12-26 LAB — MAGNESIUM: Magnesium: 1.8 mg/dL (ref 1.7–2.4)

## 2022-12-26 LAB — BETA-HYDROXYBUTYRIC ACID: Beta-Hydroxybutyric Acid: 0.16 mmol/L (ref 0.05–0.27)

## 2022-12-26 LAB — TROPONIN I (HIGH SENSITIVITY)
Troponin I (High Sensitivity): 4 ng/L (ref <18)
Troponin I (High Sensitivity): 4 ng/L (ref <18)

## 2022-12-26 LAB — ETHANOL: Alcohol, Ethyl (B): <10 mg/dL (ref <10)

## 2022-12-26 MED ORDER — DIAZEPAM 5 MG/ML IJ SOLN
10.0000 mg | Freq: Once | INTRAMUSCULAR | Status: AC
  Administered 2022-12-26: 10 mg via INTRAVENOUS
  Filled 2022-12-26: qty 2

## 2022-12-26 MED ORDER — LORAZEPAM 1 MG PO TABS
0.0000 mg | ORAL_TABLET | Freq: Four times a day (QID) | ORAL | Status: DC
  Administered 2022-12-26: 1 mg via ORAL
  Filled 2022-12-26: qty 1

## 2022-12-26 MED ORDER — LORAZEPAM 2 MG/ML IJ SOLN: 0.0000 mg | INTRAMUSCULAR | Status: DC | PRN

## 2022-12-26 MED ORDER — METOCLOPRAMIDE HCL 5 MG/ML IJ SOLN
10.0000 mg | Freq: Once | INTRAMUSCULAR | Status: AC
  Administered 2022-12-26: 10 mg via INTRAVENOUS
  Filled 2022-12-26: qty 2

## 2022-12-26 MED ORDER — LORAZEPAM 1 MG PO TABS
0.0000 mg | ORAL_TABLET | ORAL | Status: DC | PRN
  Administered 2022-12-27: 1 mg via ORAL
  Filled 2022-12-26: qty 1

## 2022-12-26 MED ORDER — ENOXAPARIN SODIUM 40 MG/0.4ML IJ SOSY
40.0000 mg | PREFILLED_SYRINGE | INTRAMUSCULAR | Status: DC
  Administered 2022-12-26 – 2022-12-27 (×2): 40 mg via SUBCUTANEOUS
  Filled 2022-12-26 (×2): qty 0.4

## 2022-12-26 MED ORDER — LORAZEPAM 2 MG/ML IJ SOLN: 0.0000 mg | Freq: Two times a day (BID) | INTRAMUSCULAR | Status: DC

## 2022-12-26 MED ORDER — LORAZEPAM 1 MG PO TABS: 0.0000 mg | ORAL_TABLET | Freq: Two times a day (BID) | ORAL | Status: DC

## 2022-12-26 MED ORDER — ONDANSETRON HCL 4 MG/2ML IJ SOLN: 4.0000 mg | Freq: Four times a day (QID) | INTRAMUSCULAR | Status: DC | PRN

## 2022-12-26 MED ORDER — LORAZEPAM 2 MG/ML IJ SOLN: 0.0000 mg | Freq: Four times a day (QID) | INTRAMUSCULAR | Status: DC

## 2022-12-26 MED ORDER — THIAMINE HCL 100 MG/ML IJ SOLN: 100.0000 mg | Freq: Every day | INTRAMUSCULAR | Status: DC

## 2022-12-26 MED ORDER — KCL-LACTATED RINGERS-D5W 20 MEQ/L IV SOLN
INTRAVENOUS | Status: DC
  Filled 2022-12-26 (×2): qty 1000

## 2022-12-26 MED ORDER — THIAMINE HCL 100 MG/ML IJ SOLN
100.0000 mg | Freq: Once | INTRAMUSCULAR | Status: AC
  Administered 2022-12-26: 100 mg via INTRAVENOUS
  Filled 2022-12-26: qty 2

## 2022-12-26 MED ORDER — THIAMINE MONONITRATE 100 MG PO TABS
100.0000 mg | ORAL_TABLET | Freq: Every day | ORAL | Status: DC
  Administered 2022-12-27 – 2022-12-28 (×2): 100 mg via ORAL
  Filled 2022-12-26 (×2): qty 1

## 2022-12-26 MED ORDER — NICOTINE 21 MG/24HR TD PT24
21.0000 mg | MEDICATED_PATCH | Freq: Every day | TRANSDERMAL | Status: DC
  Administered 2022-12-26 – 2022-12-28 (×3): 21 mg via TRANSDERMAL
  Filled 2022-12-26 (×3): qty 1

## 2022-12-26 MED ORDER — LORAZEPAM 1 MG PO TABS: 0.0000 mg | ORAL_TABLET | ORAL | Status: DC | PRN

## 2022-12-26 MED ORDER — POTASSIUM CHLORIDE CRYS ER 20 MEQ PO TBCR
40.0000 meq | EXTENDED_RELEASE_TABLET | Freq: Once | ORAL | Status: AC
  Administered 2022-12-26: 40 meq via ORAL
  Filled 2022-12-26: qty 2

## 2022-12-26 MED ORDER — LACTATED RINGERS IV BOLUS
1000.0000 mL | Freq: Once | INTRAVENOUS | Status: AC
  Administered 2022-12-26: 1000 mL via INTRAVENOUS

## 2022-12-26 NOTE — ED Triage Notes (Addendum)
Pt BIB [facility] from [facility] with reports of anxiety. Pt stating he feels extremely anxious. Pt stating he is anxious from work stress. Pt also stating his mother has COVID, whom he lives with. Pt reports drinking daily. Last drink last night.

## 2022-12-26 NOTE — Assessment & Plan Note (Signed)
Hyperbilirubinemia -Secondary to alcohol abuse and alcoholic ketoacidosis - Benign abdominal exam Can repeat CMP in the morning

## 2022-12-26 NOTE — ED Provider Notes (Signed)
[place] EMERGENCY DEPARTMENT AT [facility] Provider Note   CSN: [id number] Arrival date & time: 12/26/22  1315     History  Chief Complaint  Patient presents with   Anxiety    Shaun Ward is a 47 y.o. male.  HPI 47 year old male presents with acute shortness of breath.  Symptoms started at around noon today.  He felt acutely short of breath at lunch.  He denies any fever, cough, chest pain, chest tightness.  No sore throat or congestion.  Has been vomiting for the last 2 days and not eating well.  He denies any diarrhea or abdominal pain.  He states he typically drinks about 24 ounces of beer per day and last drink last night.  No leg swelling.  He feels like his shortness of breath is slightly improved since onset but is still definitely present and he cannot take a full breath.  Home Medications Prior to Admission medications   Medication Sig Start Date End Date Taking? Authorizing Provider  diphenhydramine-acetaminophen (TYLENOL PM) 25-500 MG TABS tablet Take 1 tablet by mouth at bedtime as needed.    [provider]  doxycycline (VIBRAMYCIN) 100 MG capsule Take 1 capsule (100 mg total) by mouth 2 (two) times daily. 06/10/22   Valinda Hoar, NP  mupirocin cream (BACTROBAN) 2 % Apply 1 Application topically daily. 06/10/22   Valinda Hoar, NP      Allergies    Patient has no known allergies.    Review of Systems   Review of Systems  Constitutional:  Negative for fever.  Respiratory:  Positive for shortness of breath. Negative for cough.   Cardiovascular:  Negative for chest pain and leg swelling.  Gastrointestinal:  Positive for nausea and vomiting. Negative for abdominal pain.  Neurological:  Positive for tremors.    Physical Exam Updated Vital Signs BP (!) 133/99   Pulse (!) 104   Temp 98.9 F (37.2 C) (Oral)   Resp 10   SpO2 100%  Physical Exam Vitals and nursing note reviewed.  Constitutional:      Appearance: He is  well-developed.  HENT:     Head: Normocephalic and atraumatic.     Mouth/Throat:     Mouth: Mucous membranes are dry.  Cardiovascular:     Rate and Rhythm: Regular rhythm. Tachycardia present.     Heart sounds: Normal heart sounds.  Pulmonary:     Effort: Pulmonary effort is normal. Tachypnea present. No accessory muscle usage.     Breath sounds: Normal breath sounds.  Abdominal:     General: There is no distension.     Palpations: Abdomen is soft.     Tenderness: There is no abdominal tenderness.  Skin:    General: Skin is warm and dry.  Neurological:     Mental Status: He is alert.     Comments: Patient is awake, alert, oriented. Does have a significant bilateral upper extremity tremor     ED Results / Procedures / Treatments   Labs (all labs ordered are listed, but only abnormal results are displayed) Labs Reviewed  CBC - Abnormal; Notable for the following components:      Result Value   WBC 12.6 (*)    RBC 3.89 (*)    HCT 37.8 (*)    MCH 34.4 (*)    RDW 17.3 (*)    All other components within normal limits  COMPREHENSIVE METABOLIC PANEL - Abnormal; Notable for the following components:   Sodium 134 (*)  Potassium 2.8 (*)    Chloride 94 (*)    CO2 13 (*)    Glucose, Bld 107 (*)    Calcium 7.7 (*)    Total Protein 6.0 (*)    Albumin 3.0 (*)    AST 64 (*)    Alkaline Phosphatase 135 (*)    Total Bilirubin 3.1 (*)    Anion gap 27 (*)    All other components within normal limits  BLOOD GAS, VENOUS - Abnormal; Notable for the following components:   pH, Ven 7.44 (*)    pCO2, Ven 29 (*)    pO2, Ven 75 (*)    Bicarbonate 19.7 (*)    Acid-base deficit 3.2 (*)    All other components within normal limits  I-STAT CG4 LACTIC ACID, ED - Abnormal; Notable for the following components:   Lactic Acid, Venous 14.5 (*)    All other components within normal limits  I-STAT CG4 LACTIC ACID, ED - Abnormal; Notable for the following components:   Lactic Acid, Venous 9.7  (*)    All other components within normal limits  SARS CORONAVIRUS 2 BY RT PCR  ETHANOL  MAGNESIUM  BETA-HYDROXYBUTYRIC ACID  RAPID URINE DRUG SCREEN, HOSP PERFORMED  BASIC METABOLIC PANEL  BASIC METABOLIC PANEL  BASIC METABOLIC PANEL  BASIC METABOLIC PANEL  LACTIC ACID, PLASMA  LACTIC ACID, PLASMA  TROPONIN I (HIGH SENSITIVITY)  TROPONIN I (HIGH SENSITIVITY)    EKG EKG Interpretation Date/Time:  Friday December 26 2022 15:58:26 EDT Ventricular Rate:  111 PR Interval:  153 QRS Duration:  79 QT Interval:  334 QTC Calculation: 454 R Axis:   81  Text Interpretation: Sinus tachycardia nonspecific T waves Confirmed by Pricilla Loveless [phone]) on 12/26/2022 4:03:43 PM  Radiology DG Chest Portable 1 View  Result Date: 12/26/2022 CLINICAL DATA:  Dyspnea EXAM: PORTABLE CHEST 1 VIEW COMPARISON:  03/05/2021 FINDINGS: The heart size and mediastinal contours are within normal limits. Both lungs are clear. The visualized skeletal structures are unremarkable. IMPRESSION: No active disease. Electronically Signed   By: Helyn Numbers M.D.   On: 12/26/2022 17:59    Procedures .Critical Care  Performed by: Pricilla Loveless, MD Authorized by: Pricilla Loveless, MD   Critical care provider statement:    Critical care time (minutes):  40   Critical care time was exclusive of:  Separately billable procedures and treating other patients   Critical care was necessary to treat or prevent imminent or life-threatening deterioration of the following conditions:  Metabolic crisis and CNS failure or compromise   Critical care was time spent personally by me on the following activities:  Development of treatment plan with patient or surrogate, discussions with consultants, evaluation of patient's response to treatment, examination of patient, ordering and review of laboratory studies, ordering and review of radiographic studies, ordering and performing treatments and interventions, pulse oximetry, re-evaluation  of patient's condition and review of old charts     Medications Ordered in ED Medications  dextrose 5% in lactated ringers with KCl 20 mEq/L infusion ( Intravenous New Bag/Given 12/26/22 1711)  nicotine (NICODERM CQ - dosed in mg/24 hours) patch 21 mg (21 mg Transdermal Patch Applied 12/26/22 1702)  thiamine (VITAMIN B1) tablet 100 mg (has no administration in time range)    Or  thiamine (VITAMIN B1) injection 100 mg (has no administration in time range)  LORazepam (ATIVAN) injection 0-4 mg (has no administration in time range)    Or  LORazepam (ATIVAN) tablet 0-4 mg (has no administration  in time range)  LORazepam (ATIVAN) injection 0-4 mg (has no administration in time range)    Or  LORazepam (ATIVAN) tablet 0-4 mg (has no administration in time range)  diazepam (VALIUM) injection 10 mg (10 mg Intravenous Given 12/26/22 1556)  metoCLOPramide (REGLAN) injection 10 mg (10 mg Intravenous Given 12/26/22 1557)  thiamine (VITAMIN B1) injection 100 mg (100 mg Intravenous Given 12/26/22 1557)  lactated ringers bolus 1,000 mL (0 mLs Intravenous Stopped 12/26/22 1840)  potassium chloride SA (KLOR-CON M) CR tablet 40 mEq (40 mEq Oral Given 12/26/22 1636)  diazepam (VALIUM) injection 10 mg (10 mg Intravenous Given 12/26/22 1713)    ED Course/ Medical Decision Making/ A&P                                 Medical Decision Making Amount and/or Complexity of Data Reviewed Labs: ordered.    Details: Lactate very elevated though improving with fluids.  Anion gap acidosis on the CMP. Radiology: ordered and independent interpretation performed.    Details: No pneumonia ECG/medicine tests: ordered and independent interpretation performed.    Details: Sinus tachycardia  Risk OTC drugs. Prescription drug management. Decision regarding hospitalization.   I think the patient's shortness of breath is coming from his metabolic acidosis.  This is likely from starvation from alcohol abuse and his recent vomiting.   He otherwise does not have any symptoms of an obvious infection and I think the lactate is more of a dehydration and the metabolic process rather than sepsis.  I do not think antibiotics are warranted.  He was given fluids as well as IV Valium multiple times for alcohol withdrawal.  His CIWA was 13.  His tremors and respiratory status has improved with the above treatments.  He will be placed on CIWA protocol.  Otherwise I have given him oral and IV potassium replacement him after thiamine we have started him on lactated Ringer's with D5 and potassium.  Discussed with Dr. Cyndia Bent for admission.        Final Clinical Impression(s) / ED Diagnoses Final diagnoses:  Alcohol withdrawal syndrome without complication (HCC)  Alcoholic ketoacidosis    Rx / DC Orders ED Discharge Orders     None         Pricilla Loveless, MD 12/26/22 1948

## 2022-12-26 NOTE — Assessment & Plan Note (Signed)
-   Patient reports drinking about two 24 ounce beer with last drink overnight.  Reports cutting down the past month and previously had 8 beers nightly. -Has received total of 20 mg IV Valium in the ED - Continue CIWA q1hr overnight with PRN ativan

## 2022-12-26 NOTE — Assessment & Plan Note (Addendum)
-   Mixed metabolic acidosis with respiratory alkalosis causing his symptoms of dyspnea - Lactate initially of 14 and now downward trended to 9.  Continue to trend lactate.  If acidosis does not improve overnight will consult PCCM. - Has already received 100 mg IV thiamine.  Will continue IV D5 LR with potassium supplementation - Trend BMP every 4 hours

## 2022-12-26 NOTE — Assessment & Plan Note (Signed)
-   Mild secondary to hypovolemia - Monitor repeat with IV fluids overnight

## 2022-12-26 NOTE — Assessment & Plan Note (Signed)
-   Chews tobacco - Encourage cessation - Nicotine patch ordered

## 2022-12-26 NOTE — H&P (Signed)
History and Physical    Patient: Shaun Ward ZOX:[id number] DOB: 04-09-1976 DOA: 12/26/2022 DOS: the patient was seen and examined on 12/26/2022 PCP: Pcp, No  Patient coming from: Home  Chief Complaint:  Chief Complaint  Patient presents with   Anxiety   HPI: Shaun Ward is a 47 y.o. male with medical history significant of emphysema, polysubstance abuse, alcohol withdrawal presents with concerns of shortness of breath and anxiety.   Patient had acute shortness of breath around lunch time. Had nausea and vomiting throughout the day. Denies any fever, cough, chest pain or tightness. No abdominal pain. He typically drinks about two 24 oz beer nightly with last drink last evening. Has been cutting down over the past month from 8 beers nightly. Chews tobacco. Denies illicit drug use.   In the ED, he was afebrile but tachycardic to HR 129, RR 18 on room air.   CBC with cytosis of 12.6, hemoglobin of 13.4.  Lactate elevated to 14 with improvement down to 9 with fluids.  CMP with hyponatremia of 134, hypokalemic of 2.8, stable creatinine of 0.93.  Anion gap of 27. AST mildly elevated at 64, ALT of 33, alkaline phosphatase 135, total bilirubin at 3.1.  pH appears to show a mixed metabolic acidosis with respiratory alkalosis picture with pH of 7.44, CO2 29 and bicarb 19.  Suspect secondary to alcoholic ketoacidosis.  Patient was given IV thiamine and started on IV D5 LR with potassium.  Also has received a total of 20 mg of Valium.  He was placed on CIWA protocol and hospitalist was consulted for admission. Review of Systems: As mentioned in the history of present illness. All other systems reviewed and are negative. Past Medical History:  Diagnosis Date   Alcohol abuse    Emphysema lung (HCC)    Polysubstance abuse (HCC)    Tobacco abuse    Past Surgical History:  Procedure Laterality Date   HERNIA REPAIR     Social History:  reports that he has been smoking cigarettes. He has never  used smokeless tobacco. He reports current alcohol use of about 6.0 - 12.0 standard drinks of alcohol per week. He reports current drug use.  No Known Allergies  Family History  Problem Relation Age of Onset   CAD Paternal Grandmother     Prior to Admission medications   Medication Sig Start Date End Date Taking? Authorizing Provider  diphenhydramine-acetaminophen (TYLENOL PM) 25-500 MG TABS tablet Take 1 tablet by mouth at bedtime as needed.    [provider]  doxycycline (VIBRAMYCIN) 100 MG capsule Take 1 capsule (100 mg total) by mouth 2 (two) times daily. 06/10/22   Valinda Hoar, NP  mupirocin cream (BACTROBAN) 2 % Apply 1 Application topically daily. 06/10/22   Valinda Hoar, NP    Physical Exam: Vitals:   12/26/22 1715 12/26/22 1730 12/26/22 1745 12/26/22 1918  BP: (!) 129/90 126/84 125/85   Pulse: (!) 101 (!) 107 (!) 103 (!) 104  Resp: 10 (!) 23 12   Temp:      TempSrc:      SpO2: 100% 98% 100%    Constitutional: NAD, calm, comfortable, nontoxic appearing male with facial flushing laying upright in bed Eyes: lids and conjunctivae normal ENMT: Mucous membranes are moist.  Neck: normal, supple Respiratory: clear to auscultation bilaterally, no wheezing, no crackles. Normal respiratory effort. No accessory muscle use.  On room air.  Able to speak in full sentences. Cardiovascular: Sinus tachycardia, no murmurs /  rubs / gallops. No extremity edema. Abdomen: no tenderness, soft, nondistended Musculoskeletal: no clubbing / cyanosis. No joint deformity upper and lower extremities.  Normal muscle tone.  Skin: no rashes, lesions, ulcers. No induration Neurologic: CN 2-12 grossly intact.  Strength 5/5 in all 4.  No tremors. Psychiatric: Normal judgment and insight. Alert and oriented x 3. Normal mood. Data Reviewed:  See HPI  Assessment and Plan: * Alcohol withdrawal (HCC) - Patient reports drinking about two 24 ounce beer with last drink overnight.  Reports  cutting down the past month and previously had 8 beers nightly. -Has received total of 20 mg IV Valium in the ED - Continue CIWA q1hr overnight with PRN ativan  Abnormal LFTs Hyperbilirubinemia -Secondary to alcohol abuse and alcoholic ketoacidosis - Benign abdominal exam Can repeat CMP in the morning  Hyponatremia - Mild secondary to hypovolemia - Monitor repeat with IV fluids overnight  Alcoholic ketoacidosis - Mixed metabolic acidosis with respiratory alkalosis causing his symptoms of dyspnea - Lactate initially of 14 and now downward trended to 9.  Continue to trend lactate.  If acidosis does not improve overnight will consult PCCM. - Has already received 100 mg IV thiamine.  Will continue IV D5 LR with potassium supplementation - Trend BMP every 4 hours  Hypokalemia - Potassium of 2.8 - Continue IV fluid with potassium infusion  Tobacco use - Chews tobacco - Encourage cessation - Nicotine patch ordered      Advance Care Planning:   Code Status: Full Code   Consults: None  Family Communication: None at bedside  Severity of Illness: The appropriate patient status for this patient is INPATIENT. Inpatient status is judged to be reasonable and necessary in order to provide the required intensity of service to ensure the patient's safety. The patient's presenting symptoms, physical exam findings, and initial radiographic and laboratory data in the context of their chronic comorbidities is felt to place them at high risk for further clinical deterioration. Furthermore, it is not anticipated that the patient will be medically stable for discharge from the hospital within 2 midnights of admission.   * I certify that at the point of admission it is my clinical judgment that the patient will require inpatient hospital care spanning beyond 2 midnights from the point of admission due to high intensity of service, high risk for further deterioration and high frequency of surveillance  required.*  Author: Anselm Jungling, DO 12/26/2022 8:34 PM  For on call review [email or web address].

## 2022-12-26 NOTE — Assessment & Plan Note (Signed)
-   Potassium of 2.8 - Continue IV fluid with potassium infusion

## 2022-12-26 NOTE — ED Notes (Signed)
Lab called to add on Mag. 

## 2022-12-27 DIAGNOSIS — E8729 Other acidosis: Secondary | ICD-10-CM | POA: Diagnosis not present

## 2022-12-27 DIAGNOSIS — Z720 Tobacco use: Secondary | ICD-10-CM

## 2022-12-27 DIAGNOSIS — E876 Hypokalemia: Secondary | ICD-10-CM

## 2022-12-27 DIAGNOSIS — E871 Hypo-osmolality and hyponatremia: Secondary | ICD-10-CM

## 2022-12-27 DIAGNOSIS — R7989 Other specified abnormal findings of blood chemistry: Secondary | ICD-10-CM

## 2022-12-27 DIAGNOSIS — F10930 Alcohol use, unspecified with withdrawal, uncomplicated: Secondary | ICD-10-CM

## 2022-12-27 LAB — LACTIC ACID, PLASMA
Lactic Acid, Venous: 2.2 mmol/L (ref 0.5–1.9)
Lactic Acid, Venous: 1 mmol/L (ref 0.5–1.9)

## 2022-12-27 LAB — CBC
HCT: 31 % — ABNORMAL LOW (ref 39.0–52.0)
Hemoglobin: 10.5 g/dL — ABNORMAL LOW (ref 13.0–17.0)
MCH: 34.2 pg — ABNORMAL HIGH (ref 26.0–34.0)
MCHC: 33.9 g/dL (ref 30.0–36.0)
MCV: 101 fL — ABNORMAL HIGH (ref 80.0–100.0)
Platelets: 146 10*3/uL — ABNORMAL LOW (ref 150–400)
RBC: 3.07 MIL/uL — ABNORMAL LOW (ref 4.22–5.81)
RDW: 17.5 % — ABNORMAL HIGH (ref 11.5–15.5)
WBC: 4.7 10*3/uL (ref 4.0–10.5)
nRBC: 0.6 % — ABNORMAL HIGH (ref 0.0–0.2)

## 2022-12-27 LAB — BASIC METABOLIC PANEL
Anion gap: 8 (ref 5–15)
Anion gap: 8 (ref 5–15)
Anion gap: 8 (ref 5–15)
BUN: 6 mg/dL (ref 6–20)
BUN: 7 mg/dL (ref 6–20)
BUN: 7 mg/dL (ref 6–20)
CO2: 29 mmol/L (ref 22–32)
CO2: 30 mmol/L (ref 22–32)
CO2: 27 mmol/L (ref 22–32)
Calcium: 7.9 mg/dL — ABNORMAL LOW (ref 8.9–10.3)
Calcium: 7.7 mg/dL — ABNORMAL LOW (ref 8.9–10.3)
Calcium: 7.6 mg/dL — ABNORMAL LOW (ref 8.9–10.3)
Chloride: 95 mmol/L — ABNORMAL LOW (ref 98–111)
Chloride: 95 mmol/L — ABNORMAL LOW (ref 98–111)
Chloride: 97 mmol/L — ABNORMAL LOW (ref 98–111)
Creatinine, Ser: 0.85 mg/dL (ref 0.61–1.24)
Creatinine, Ser: 0.79 mg/dL (ref 0.61–1.24)
Creatinine, Ser: 0.68 mg/dL (ref 0.61–1.24)
GFR, Estimated: >60 mL/min (ref >60)
GFR, Estimated: >60 mL/min (ref >60)
GFR, Estimated: >60 mL/min (ref >60)
Glucose, Bld: 137 mg/dL — ABNORMAL HIGH (ref 70–99)
Glucose, Bld: 68 mg/dL — ABNORMAL LOW (ref 70–99)
Glucose, Bld: 98 mg/dL (ref 70–99)
Potassium: 4 mmol/L (ref 3.5–5.1)
Potassium: 3.6 mmol/L (ref 3.5–5.1)
Potassium: 3.9 mmol/L (ref 3.5–5.1)
Sodium: 132 mmol/L — ABNORMAL LOW (ref 135–145)
Sodium: 133 mmol/L — ABNORMAL LOW (ref 135–145)
Sodium: 132 mmol/L — ABNORMAL LOW (ref 135–145)

## 2022-12-27 MED ORDER — TRAZODONE HCL 50 MG PO TABS
50.0000 mg | ORAL_TABLET | Freq: Every evening | ORAL | Status: DC | PRN
  Administered 2022-12-27: 50 mg via ORAL
  Filled 2022-12-27: qty 1

## 2022-12-27 MED ORDER — SODIUM CHLORIDE 0.9 % IV SOLN: INTRAVENOUS | Status: DC

## 2022-12-27 NOTE — ED Notes (Signed)
ED TO INPATIENT HANDOFF REPORT  Name/Age/Gender Shaun Ward 47 y.o. male  Code Status    Code Status Orders  (From admission, onward)           Start     Ordered   12/26/22 2016  Full code  Continuous       Question:  By:  Answer:  Consent: discussion documented in EHR   12/26/22 2017           Code Status History     Date Active Date Inactive Code Status Order ID Comments User Context   06/16/2019 0755 06/20/2019 2000 Full Code [id number]  Lorretta Harp, MD ED   05/03/2018 1815 05/08/2018 1846 Full Code [id number]  Enid Baas, MD Inpatient       Home/SNF/Other Home  Chief Complaint Alcohol withdrawal (HCC) [F10.939]  Level of Care/Admitting Diagnosis ED Disposition     ED Disposition  Admit   Condition  --   Comment  Hospital Area: [facility] [[id number]]  Level of Care: Telemetry [5]  Admit to tele based on following criteria: Complex arrhythmia (Bradycardia/Tachycardia)  May admit patient to [facility] or [facility] if equivalent level of care is available:: Yes  Covid Evaluation: Asymptomatic - no recent exposure (last 10 days) testing not required  Diagnosis: Alcohol withdrawal (HCC) [291.81.ICD-9-CM]  Admitting Physician: Kathlen Mody [[id number]]  Attending Physician: Kathlen Mody [phone]  Certification:: I certify this patient will need inpatient services for at least 2 midnights          Medical History Past Medical History:  Diagnosis Date   Alcohol abuse    Emphysema lung (HCC)    Polysubstance abuse (HCC)    Tobacco abuse     Allergies No Known Allergies  IV Location/Drains/Wounds Patient Lines/Drains/Airways Status     Active Line/Drains/Airways     Name Placement date Placement time Site Days   Peripheral IV 12/26/22 20 G Left Antecubital 12/26/22  1547  Antecubital  1   Peripheral IV 12/26/22 20 G Anterior;Proximal;Right Forearm 12/26/22  1639  Forearm  1   Pressure Injury 05/03/18 Unstageable - Full  thickness tissue loss in which the base of the ulcer is covered by slough (yellow, tan, gray, green or brown) and/or eschar (tan, brown or black) in the wound bed. 05/03/18  1900  -- 1699            Labs/Imaging Results for orders placed or performed during the hospital encounter of 12/26/22 (from the past 48 hour(s))  CBC     Status: Abnormal   Collection Time: 12/26/22  1:31 PM  Result Value Ref Range   WBC 12.6 (H) 4.0 - 10.5 K/uL   RBC 3.89 (L) 4.22 - 5.81 MIL/uL   Hemoglobin 13.4 13.0 - 17.0 g/dL   HCT 62.8 (L) 31.5 - 17.6 %   MCV 97.2 80.0 - 100.0 fL   MCH 34.4 (H) 26.0 - 34.0 pg   MCHC 35.4 30.0 - 36.0 g/dL   RDW 16.0 (H) 73.7 - 10.6 %   Platelets 193 150 - 400 K/uL   nRBC 0.2 0.0 - 0.2 %    Comment: Performed at [facility], [address], [place], [place] [address]  Comprehensive metabolic panel     Status: Abnormal   Collection Time: 12/26/22  1:31 PM  Result Value Ref Range   Sodium 134 (L) 135 - 145 mmol/L   Potassium 2.8 (L) 3.5 - 5.1 mmol/L   Chloride 94 (L)  98 - 111 mmol/L   CO2 13 (L) 22 - 32 mmol/L   Glucose, Bld 107 (H) 70 - 99 mg/dL    Comment: Glucose reference range applies only to samples taken after fasting for at least 8 hours.   BUN 6 6 - 20 mg/dL   Creatinine, Ser 0.86 0.61 - 1.24 mg/dL   Calcium 7.7 (L) 8.9 - 10.3 mg/dL   Total Protein 6.0 (L) 6.5 - 8.1 g/dL   Albumin 3.0 (L) 3.5 - 5.0 g/dL   AST 64 (H) 15 - 41 U/L   ALT 33 0 - 44 U/L   Alkaline Phosphatase 135 (H) 38 - 126 U/L   Total Bilirubin 3.1 (H) 0.3 - 1.2 mg/dL   GFR, Estimated >57 >84 mL/min    Comment: (NOTE) Calculated using the CKD-EPI Creatinine Equation (2021)    Anion gap 27 (H) 5 - 15    Comment: ELECTROLYTES REPEATED TO VERIFY Performed at [facility], [address], [place], [place] [address]   Ethanol     Status: None   Collection Time: 12/26/22  1:31 PM  Result Value Ref Range   Alcohol, Ethyl (B) <10 <10 mg/dL    Comment:  (NOTE) Lowest detectable limit for serum alcohol is 10 mg/dL.  For medical purposes only. Performed at [facility], [address], [place], [place] [address]   Magnesium     Status: None   Collection Time: 12/26/22  1:31 PM  Result Value Ref Range   Magnesium 1.8 1.7 - 2.4 mg/dL    Comment: Performed at [facility], [address] [place], [place] [address]  Troponin I (High Sensitivity)     Status: None   Collection Time: 12/26/22  3:58 PM  Result Value Ref Range   Troponin I (High Sensitivity) 4 <18 ng/L    Comment: (NOTE) Elevated high sensitivity troponin I (hsTnI) values and significant  changes across serial measurements may suggest ACS but many other  chronic and acute conditions are known to elevate hsTnI results.  Refer to the "Links" section for chest pain algorithms and additional  guidance. Performed at [facility], [address] [place], [place] [address]   Beta-hydroxybutyric acid     Status: None   Collection Time: 12/26/22  3:58 PM  Result Value Ref Range   Beta-Hydroxybutyric Acid 0.16 0.05 - 0.27 mmol/L    Comment: Performed at [facility], [address] [place], [place] [address]  I-Stat Lactic Acid     Status: Abnormal   Collection Time: 12/26/22  4:08 PM  Result Value Ref Range   Lactic Acid, Venous 14.5 (HH) 0.5 - 1.9 mmol/L   Comment NOTIFIED PHYSICIAN   Blood gas, venous     Status: Abnormal   Collection Time: 12/26/22  4:12 PM  Result Value Ref Range   pH, Ven 7.44 (H) 7.25 - 7.43   pCO2, Ven 29 (L) 44 - 60 mmHg   pO2, Ven 75 (H) 32 - 45 mmHg   Bicarbonate 19.7 (L) 20.0 - 28.0 mmol/L   Acid-base deficit 3.2 (H) 0.0 - 2.0 mmol/L   O2 Saturation 96 %   Patient temperature 37.0     Comment: Performed at [facility], [address] [place], [place] [address]  SARS Coronavirus 2 by RT PCR (hospital order, performed in [facility] hospital lab)  *cepheid single result test* Anterior Nasal Swab     Status: None  Collection Time: 12/26/22  4:39 PM   Specimen: Anterior Nasal Swab  Result Value Ref Range   SARS Coronavirus 2 by RT PCR NEGATIVE NEGATIVE    Comment: (NOTE) SARS-CoV-2 target nucleic acids are NOT DETECTED.  The SARS-CoV-2 RNA is generally detectable in upper and lower respiratory specimens during the acute phase of infection. The lowest concentration of SARS-CoV-2 viral copies this assay can detect is 250 copies / mL. A negative result does not preclude SARS-CoV-2 infection and should not be used as the sole basis for treatment or other patient management decisions.  A negative result may occur with improper specimen collection / handling, submission of specimen other than nasopharyngeal swab, presence of viral mutation(s) within the areas targeted by this assay, and inadequate number of viral copies (<250 copies / mL). A negative result must be combined with clinical observations, patient history, and epidemiological information.  Fact Sheet for Patients:   RoadLapTop.co.za  Fact Sheet for Healthcare Providers: http://kim-miller.com/  This test is not yet approved or  cleared by the [place] FDA and has been authorized for detection and/or diagnosis of SARS-CoV-2 by FDA under an Emergency Use Authorization (EUA).  This EUA will remain in effect (meaning this test can be used) for the duration of the COVID-19 declaration under Section 564(b)(1) of the Act, 21 U.S.C. section 360bbb-3(b)(1), unless the authorization is terminated or revoked sooner.  Performed at [facility], [address], [place], [place] [address]   Troponin I (High Sensitivity)     Status: None   Collection Time: 12/26/22  6:00 PM  Result Value Ref Range   Troponin I (High Sensitivity) 4 <18 ng/L    Comment: (NOTE) Elevated high sensitivity troponin I (hsTnI) values  and significant  changes across serial measurements may suggest ACS but many other  chronic and acute conditions are known to elevate hsTnI results.  Refer to the "Links" section for chest pain algorithms and additional  guidance. Performed at [facility], [address], [place], [place] [address]   I-Stat Lactic Acid     Status: Abnormal   Collection Time: 12/26/22  6:05 PM  Result Value Ref Range   Lactic Acid, Venous 9.7 (HH) 0.5 - 1.9 mmol/L   Comment NOTIFIED PHYSICIAN   Basic metabolic panel     Status: Abnormal   Collection Time: 12/26/22  8:37 PM  Result Value Ref Range   Sodium 129 (L) 135 - 145 mmol/L   Potassium 3.6 3.5 - 5.1 mmol/L   Chloride 90 (L) 98 - 111 mmol/L   CO2 26 22 - 32 mmol/L   Glucose, Bld 141 (H) 70 - 99 mg/dL    Comment: Glucose reference range applies only to samples taken after fasting for at least 8 hours.   BUN 6 6 - 20 mg/dL   Creatinine, Ser 8.29 0.61 - 1.24 mg/dL   Calcium 7.7 (L) 8.9 - 10.3 mg/dL   GFR, Estimated >56 >21 mL/min    Comment: (NOTE) Calculated using the CKD-EPI Creatinine Equation (2021)    Anion gap 13 5 - 15    Comment: Performed at [facility], [address], [place], [place] [address]  Lactic acid, plasma     Status: Abnormal   Collection Time: 12/26/22  8:37 PM  Result Value Ref Range   Lactic Acid, Venous 5.3 (HH) 0.5 - 1.9 mmol/L    Comment: CRITICAL RESULT CALLED TO, READ BACK BY AND VERIFIED WITH GREY, S RN @  2117 12/26/22. GILBERTL Performed at [facility], [address], [place], [place] [address]   HIV Antibody (routine testing w rflx)     Status: None   Collection Time: 12/26/22  8:37 PM  Result Value Ref Range   HIV Screen 4th Generation wRfx Non Reactive Non Reactive    Comment: Performed at [facility], [address] [place], [place] [address]  Urine rapid drug screen (hosp performed)     Status: Abnormal   Collection Time: 12/26/22 10:51  PM  Result Value Ref Range   Opiates NONE DETECTED NONE DETECTED   Cocaine NONE DETECTED NONE DETECTED   Benzodiazepines POSITIVE (A) NONE DETECTED   Amphetamines NONE DETECTED NONE DETECTED   Tetrahydrocannabinol POSITIVE (A) NONE DETECTED   Barbiturates NONE DETECTED NONE DETECTED    Comment: (NOTE) DRUG SCREEN FOR MEDICAL PURPOSES ONLY.  IF CONFIRMATION IS NEEDED FOR ANY PURPOSE, NOTIFY LAB WITHIN 5 DAYS.  LOWEST DETECTABLE LIMITS FOR URINE DRUG SCREEN Drug Class                     Cutoff (ng/mL) Amphetamine and metabolites    1000 Barbiturate and metabolites    200 Benzodiazepine                 200 Opiates and metabolites        300 Cocaine and metabolites        300 THC                            50 Performed at [facility], [address] [place], [place] [address]   Basic metabolic panel     Status: Abnormal   Collection Time: 12/27/22 12:43 AM  Result Value Ref Range   Sodium 132 (L) 135 - 145 mmol/L   Potassium 4.0 3.5 - 5.1 mmol/L   Chloride 95 (L) 98 - 111 mmol/L   CO2 29 22 - 32 mmol/L   Glucose, Bld 137 (H) 70 - 99 mg/dL    Comment: Glucose reference range applies only to samples taken after fasting for at least 8 hours.   BUN 6 6 - 20 mg/dL   Creatinine, Ser 9.14 0.61 - 1.24 mg/dL   Calcium 7.9 (L) 8.9 - 10.3 mg/dL   GFR, Estimated >78 >29 mL/min    Comment: (NOTE) Calculated using the CKD-EPI Creatinine Equation (2021)    Anion gap 8 5 - 15    Comment: Performed at [facility], [address] [place], [place] [address]  Lactic acid, plasma     Status: Abnormal   Collection Time: 12/27/22 12:43 AM  Result Value Ref Range   Lactic Acid, Venous 2.2 (HH) 0.5 - 1.9 mmol/L    Comment: CRITICAL RESULT CALLED TO, READ BACK BY AND VERIFIED WITH METCALF, A RN @ 0124 12/27/22. GILBERTL Performed at [facility], [address] [place], [place] [address]   Basic metabolic panel     Status: Abnormal    Collection Time: 12/27/22  3:34 AM  Result Value Ref Range   Sodium 133 (L) 135 - 145 mmol/L   Potassium 3.6 3.5 - 5.1 mmol/L   Chloride 95 (L) 98 - 111 mmol/L   CO2 30 22 - 32 mmol/L   Glucose, Bld 68 (L) 70 - 99 mg/dL    Comment: Glucose reference range applies only to samples taken after fasting for at  least 8 hours.   BUN 7 6 - 20 mg/dL   Creatinine, Ser 9.60 0.61 - 1.24 mg/dL   Calcium 7.7 (L) 8.9 - 10.3 mg/dL   GFR, Estimated >45 >40 mL/min    Comment: (NOTE) Calculated using the CKD-EPI Creatinine Equation (2021)    Anion gap 8 5 - 15    Comment: Performed at [facility], [address] [place], [place] [address]  Basic metabolic panel     Status: Abnormal   Collection Time: 12/27/22 10:34 AM  Result Value Ref Range   Sodium 132 (L) 135 - 145 mmol/L   Potassium 3.9 3.5 - 5.1 mmol/L   Chloride 97 (L) 98 - 111 mmol/L   CO2 27 22 - 32 mmol/L   Glucose, Bld 98 70 - 99 mg/dL    Comment: Glucose reference range applies only to samples taken after fasting for at least 8 hours.   BUN 7 6 - 20 mg/dL   Creatinine, Ser 1.47 0.61 - 1.24 mg/dL   Calcium 7.6 (L) 8.9 - 10.3 mg/dL   GFR, Estimated >82 >95 mL/min    Comment: (NOTE) Calculated using the CKD-EPI Creatinine Equation (2021)    Anion gap 8 5 - 15    Comment: Performed at [facility], [address] [place], [place] [address]  Lactic acid, plasma     Status: None   Collection Time: 12/27/22 10:34 AM  Result Value Ref Range   Lactic Acid, Venous 1.0 0.5 - 1.9 mmol/L    Comment: Performed at [facility], [address] [place], [place] [address]   DG Chest Portable 1 View  Result Date: 12/26/2022 CLINICAL DATA:  Dyspnea EXAM: PORTABLE CHEST 1 VIEW COMPARISON:  03/05/2021 FINDINGS: The heart size and mediastinal contours are within normal limits. Both lungs are clear. The visualized skeletal structures are unremarkable. IMPRESSION: No active disease. Electronically  Signed   By: Helyn Numbers M.D.   On: 12/26/2022 17:59    Pending Labs Unresulted Labs (From admission, onward)     Start     Ordered   12/27/22 0500  CBC  Tomorrow morning,   R        12/26/22 2017            Vitals/Pain Today's Vitals   12/27/22 0721 12/27/22 0728 12/27/22 0854 12/27/22 1002  BP:   127/83 (!) 141/80  Pulse:   89 (!) 105  Resp:   13   Temp:  98.3 F (36.8 C) 97.8 F (36.6 C)   TempSrc:  Oral Oral   SpO2:   97%   PainSc: 0-No pain       Isolation Precautions No active isolations  Medications Medications  nicotine (NICODERM CQ - dosed in mg/24 hours) patch 21 mg (21 mg Transdermal Patch Applied 12/27/22 1001)  thiamine (VITAMIN B1) tablet 100 mg (100 mg Oral Given 12/27/22 1001)    Or  thiamine (VITAMIN B1) injection 100 mg ( Intravenous See Alternative 12/27/22 1001)  LORazepam (ATIVAN) injection 0-4 mg ( Intravenous See Alternative 12/27/22 1008)    Or  LORazepam (ATIVAN) tablet 0-4 mg (1 mg Oral Given 12/27/22 1008)  LORazepam (ATIVAN) injection 0-4 mg (has no administration in time range)    Or  LORazepam (ATIVAN) tablet 0-4 mg (has no administration in time range)  enoxaparin (LOVENOX) injection 40 mg (40 mg Subcutaneous Given 12/26/22 2145)  ondansetron (ZOFRAN) injection 4 mg (has no administration in time range)  0.9 %  sodium chloride infusion ( Intravenous Infusion Verify 12/27/22 0645)  diazepam (VALIUM) injection 10 mg (10 mg Intravenous Given 12/26/22 1556)  metoCLOPramide (REGLAN) injection 10 mg (10 mg Intravenous Given 12/26/22 1557)  thiamine (VITAMIN B1) injection 100 mg (100 mg Intravenous Given 12/26/22 1557)  lactated ringers bolus 1,000 mL (0 mLs Intravenous Stopped 12/26/22 1840)  potassium chloride SA (KLOR-CON M) CR tablet 40 mEq (40 mEq Oral Given 12/26/22 1636)  diazepam (VALIUM) injection 10 mg (10 mg Intravenous Given 12/26/22 1713)    Mobility walks

## 2022-12-27 NOTE — Progress Notes (Signed)
[facility]                                                                               Shaun Ward, is a 47 y.o. male, DOB - 22-Jun-1975, XBJ:[id number] Admit date - 12/26/2022    Outpatient Primary MD for the patient is Pcp, No  LOS - 1  days    Brief summary    47 y.o. male with medical history significant of emphysema, polysubstance abuse, alcohol withdrawal presents with concerns of shortness of breath and anxiety.    CMP with hyponatremia of 134, hypokalemic of 2.8, stable creatinine of 0.93.  Anion gap of 27. AST mildly elevated at 64, ALT of 33, alkaline phosphatase 135, total bilirubin at 3.1.   pH appears to show a mixed metabolic acidosis with respiratory alkalosis picture with pH of 7.44, CO2 29 and bicarb 19.  Suspect secondary to alcoholic ketoacidosis.  He was placed on CIWA protocol and hospitalist was consulted for admission.     Assessment & Plan    Assessment and Plan: * Alcohol withdrawal (HCC) - Patient reports drinking about two 24 ounce beer with last drink overnight.  Reports cutting down the past month and previously had 8 beers nightly. - withdrawal symptoms improving, tremors are better.  - tachycardia improving, tremors are better.   Abnormal LFTs Hyperbilirubinemia -Secondary to alcohol abuse and alcoholic ketoacidosis - Benign abdominal exam   Hyponatremia - Mild secondary to hypovolemia - improved to 132 and stable.   Alcoholic ketoacidosis - Mixed metabolic acidosis with respiratory alkalosis causing his symptoms of dyspnea.  - lactic acidosis resolved.     Hypokalemia Replaced.   Tobacco use Nicotine patch.   Macrocytic anemia:  Hemoglobin around 10.5 Check anemia panel.    Substance abuse:  UDS is positive for THC and benzo.    RN Pressure Injury Documentation: Pressure Injury 05/03/18 Unstageable - Full thickness tissue loss in which the base of the ulcer is covered by slough (yellow, tan, gray, green  or brown) and/or eschar (tan, brown or black) in the wound bed. (Active)  05/03/18 1900  Location: Elbow  Location Orientation: Right;Posterior  Staging: Unstageable - Full thickness tissue loss in which the base of the ulcer is covered by slough (yellow, tan, gray, green or brown) and/or eschar (tan, brown or black) in the wound bed.  Wound Description (Comments):   Present on Admission: Yes     Estimated body mass index is 18.28 kg/m as calculated from the following:   Height as of this encounter: 5' 6.5" (1.689 m).   Weight as of this encounter: 52.2 kg.  Code Status: full code DVT Prophylaxis:  enoxaparin (LOVENOX) injection 40 mg Start: 12/26/22 2200   Level of Care: Level of care: Telemetry Family Communication:none at bedside  Disposition Plan:     Remains inpatient appropriate:  alcohol withdrawal.  Procedures:  None   Consultants:   None   Antimicrobials:   Anti-infectives (From admission, onward)    None        Medications  Scheduled Meds:  enoxaparin (LOVENOX) injection  40 mg Subcutaneous Q24H   nicotine  21 mg Transdermal Daily   thiamine  100 mg Oral Daily   Or   thiamine  100 mg Intravenous Daily   Continuous Infusions:  sodium chloride 75 mL/hr at 12/27/22 0645   PRN Meds:.LORazepam **OR** LORazepam, [START ON 12/29/2022] LORazepam **OR** [START ON 12/29/2022] LORazepam, ondansetron (ZOFRAN) IV    Subjective:   Shaun Ward was seen and examined today.  Reports feeling better.   Objective:   Vitals:   12/27/22 0854 12/27/22 1002 12/27/22 1214 12/27/22 1631  BP: 127/83 (!) 141/80 (!) 124/100 110/77  Pulse: 89 (!) 105 (!) 106 90  Resp: 13  19 16   Temp: 97.8 F (36.6 C)  98.4 F (36.9 C) 98.8 F (37.1 C)  TempSrc: Oral  Oral Oral  SpO2: 97%  96% 98%  Weight:   52.2 kg   Height:   5' 6.5" (1.689 m)     Intake/Output Summary (Last 24 hours) at 12/27/2022 1747 Last data filed at 12/27/2022 1630 Gross per 24 hour  Intake 2786.82 ml   Output 1125 ml  Net 1661.82 ml   Filed Weights   12/27/22 1214  Weight: 52.2 kg     Exam General exam: Appears calm and comfortable  Respiratory system: Clear to auscultation. Respiratory effort normal. Cardiovascular system: S1 & S2 heard, tachycardia.No JVD,  Gastrointestinal system: Abdomen is nondistended, soft and nontender.  Central nervous system: Alert and oriented. No focal neurological deficits. Extremities: Symmetric 5 x 5 power. Skin: No rashes, lesions or ulcers Psychiatry: Mood & affect appropriate.    Data Reviewed:  I have personally reviewed following labs and imaging studies   CBC Lab Results  Component Value Date   WBC 4.7 12/27/2022   RBC 3.07 (L) 12/27/2022   HGB 10.5 (L) 12/27/2022   HCT 31.0 (L) 12/27/2022   MCV 101.0 (H) 12/27/2022   MCH 34.2 (H) 12/27/2022   PLT 146 (L) 12/27/2022   MCHC 33.9 12/27/2022   RDW 17.5 (H) 12/27/2022   LYMPHSABS 1.2 03/05/2021   MONOABS 0.4 03/05/2021   EOSABS 0.0 03/05/2021   BASOSABS 0.0 03/05/2021     Last metabolic panel Lab Results  Component Value Date   NA 132 (L) 12/27/2022   K 3.9 12/27/2022   CL 97 (L) 12/27/2022   CO2 27 12/27/2022   BUN 7 12/27/2022   CREATININE 0.68 12/27/2022   GLUCOSE 98 12/27/2022   GFRNONAA >60 12/27/2022   GFRAA >60 06/20/2019   CALCIUM 7.6 (L) 12/27/2022   PHOS 4.3 05/08/2018   PROT 6.0 (L) 12/26/2022   ALBUMIN 3.0 (L) 12/26/2022   BILITOT 3.1 (H) 12/26/2022   ALKPHOS 135 (H) 12/26/2022   AST 64 (H) 12/26/2022   ALT 33 12/26/2022   ANIONGAP 8 12/27/2022    CBG (last 3)  No results for input(s): "GLUCAP" in the last 72 hours.    Coagulation Profile: No results for input(s): "INR", "PROTIME" in the last 168 hours.   Radiology Studies: DG Chest Portable 1 View  Result Date: 12/26/2022 CLINICAL DATA:  Dyspnea EXAM: PORTABLE CHEST 1 VIEW COMPARISON:  03/05/2021 FINDINGS: The heart size and mediastinal contours are within normal limits. Both lungs are clear.  The visualized skeletal structures are unremarkable. IMPRESSION: No active disease. Electronically Signed   By: Helyn Numbers M.D.   On: 12/26/2022 17:59       Kathlen Mody M.D. [facility] 12/27/2022, 5:47 PM  Available via Epic secure chat 7am-7pm After 7 pm, please refer to night coverage provider listed on amion.

## 2022-12-28 DIAGNOSIS — E8729 Other acidosis: Secondary | ICD-10-CM | POA: Diagnosis not present

## 2022-12-28 DIAGNOSIS — R7989 Other specified abnormal findings of blood chemistry: Secondary | ICD-10-CM | POA: Diagnosis not present

## 2022-12-28 DIAGNOSIS — E876 Hypokalemia: Secondary | ICD-10-CM | POA: Diagnosis not present

## 2022-12-28 DIAGNOSIS — F10930 Alcohol use, unspecified with withdrawal, uncomplicated: Secondary | ICD-10-CM | POA: Diagnosis not present

## 2022-12-28 LAB — CBC WITH DIFFERENTIAL/PLATELET
Abs Immature Granulocytes: 0.03 10*3/uL (ref 0.00–0.07)
Basophils Absolute: 0 10*3/uL (ref 0.0–0.1)
Basophils Relative: 1 %
Eosinophils Absolute: 0.1 10*3/uL (ref 0.0–0.5)
Eosinophils Relative: 1 %
HCT: 27.8 % — ABNORMAL LOW (ref 39.0–52.0)
Hemoglobin: 9.1 g/dL — ABNORMAL LOW (ref 13.0–17.0)
Immature Granulocytes: 1 %
Lymphocytes Relative: 30 %
Lymphs Abs: 1.5 10*3/uL (ref 0.7–4.0)
MCH: 34.6 pg — ABNORMAL HIGH (ref 26.0–34.0)
MCHC: 32.7 g/dL (ref 30.0–36.0)
MCV: 105.7 fL — ABNORMAL HIGH (ref 80.0–100.0)
Monocytes Absolute: 0.3 10*3/uL (ref 0.1–1.0)
Monocytes Relative: 6 %
Neutro Abs: 3.1 10*3/uL (ref 1.7–7.7)
Neutrophils Relative %: 61 %
Platelets: 119 10*3/uL — ABNORMAL LOW (ref 150–400)
RBC: 2.63 MIL/uL — ABNORMAL LOW (ref 4.22–5.81)
RDW: 17.8 % — ABNORMAL HIGH (ref 11.5–15.5)
WBC: 5.1 10*3/uL (ref 4.0–10.5)
nRBC: 0 % (ref 0.0–0.2)

## 2022-12-28 LAB — VITAMIN B12: Vitamin B-12: 351 pg/mL (ref 180–914)

## 2022-12-28 LAB — FERRITIN: Ferritin: 264 ng/mL (ref 24–336)

## 2022-12-28 LAB — IRON AND TIBC
Iron: 150 ug/dL (ref 45–182)
Saturation Ratios: 96 % — ABNORMAL HIGH (ref 17.9–39.5)
TIBC: 157 ug/dL — ABNORMAL LOW (ref 250–450)
UIBC: 7 ug/dL

## 2022-12-28 LAB — BASIC METABOLIC PANEL
Anion gap: 9 (ref 5–15)
BUN: 8 mg/dL (ref 6–20)
CO2: 22 mmol/L (ref 22–32)
Calcium: 7.2 mg/dL — ABNORMAL LOW (ref 8.9–10.3)
Chloride: 102 mmol/L (ref 98–111)
Creatinine, Ser: 0.64 mg/dL (ref 0.61–1.24)
GFR, Estimated: >60 mL/min (ref >60)
Glucose, Bld: 71 mg/dL (ref 70–99)
Potassium: 4.3 mmol/L (ref 3.5–5.1)
Sodium: 133 mmol/L — ABNORMAL LOW (ref 135–145)

## 2022-12-28 LAB — RETICULOCYTES
Immature Retic Fract: 15.1 % (ref 2.3–15.9)
RBC.: 2.6 MIL/uL — ABNORMAL LOW (ref 4.22–5.81)
Retic Count, Absolute: 55.4 10*3/uL (ref 19.0–186.0)
Retic Ct Pct: 2.1 % (ref 0.4–3.1)

## 2022-12-28 LAB — FOLATE: Folate: 2.5 ng/mL — ABNORMAL LOW (ref >5.9)

## 2022-12-28 MED ORDER — HYDROXYZINE HCL 50 MG PO TABS: 50.0000 mg | ORAL_TABLET | Freq: Three times a day (TID) | ORAL | 0 refills | Status: AC | PRN

## 2022-12-28 MED ORDER — FOLIC ACID 1 MG PO TABS: 1.0000 mg | ORAL_TABLET | Freq: Every day | ORAL | 2 refills | Status: AC

## 2022-12-28 MED ORDER — NICOTINE 21 MG/24HR TD PT24: 21.0000 mg | MEDICATED_PATCH | Freq: Every day | TRANSDERMAL | 0 refills | Status: AC

## 2022-12-28 MED ORDER — FOLIC ACID 1 MG PO TABS: 1.0000 mg | ORAL_TABLET | Freq: Every day | ORAL | Status: DC

## 2022-12-28 MED ORDER — VITAMIN B-1 100 MG PO TABS: 100.0000 mg | ORAL_TABLET | Freq: Every day | ORAL | 2 refills | Status: AC

## 2022-12-28 NOTE — Discharge Summary (Signed)
Physician Discharge Summary   Patient: Shaun Ward MRN: [id number] DOB: 03-18-1976  Admit date:     12/26/2022  Discharge date: 12/28/22  Discharge Physician: Kathlen Mody   PCP: Pcp, No   Recommendations at discharge:  Please follow up with PCP in one week.    Discharge Diagnoses: Principal Problem:   Alcohol withdrawal (HCC) Active Problems:   Tobacco use   Hypokalemia   Alcoholic ketoacidosis   Hyponatremia   Abnormal LFTs   Hyperbilirubinemia    Hospital Course:   47 y.o. male with medical history significant of emphysema, polysubstance abuse, alcohol withdrawal presents with concerns of shortness of breath and anxiety.    CMP with hyponatremia of 134, hypokalemic of 2.8, stable creatinine of 0.93.  Anion gap of 27. AST mildly elevated at 64, ALT of 33, alkaline phosphatase 135, total bilirubin at 3.1.   pH appears to show a mixed metabolic acidosis with respiratory alkalosis picture with pH of 7.44, CO2 29 and bicarb 19.  Suspect secondary to alcoholic ketoacidosis.   He was placed on CIWA protocol and hospitalist was consulted for admission.   Assessment and Plan: Alcohol withdrawal (HCC) - Patient reports drinking about two 24 ounce beer with last drink overnight.  Reports cutting down the past month and previously had 8 beers nightly. - withdrawal symptoms improving, tremors are better.  - patient reports he is back to baseline, no withdrawal symptoms.    Abnormal LFTs Hyperbilirubinemia -Secondary to alcohol abuse and alcoholic ketoacidosis - Benign abdominal exam     Hyponatremia - Mild secondary to hypovolemia - improved to 132 and stable.    Alcoholic ketoacidosis - Mixed metabolic acidosis with respiratory alkalosis causing his symptoms of dyspnea.  - lactic acidosis resolved.        Hypokalemia Replaced.    Tobacco use Nicotine patch.    Macrocytic anemia:  Hemoglobin around 10.5 Folic acid deficiency, replaced.      Substance abuse:   UDS is positive for THC and benzo.      RN Pressure Injury Documentation: Pressure Injury 05/03/18 Unstageable - Full thickness tissue loss in which the base of the ulcer is covered by slough (yellow, tan, gray, green or brown) and/or eschar (tan, brown or black) in the wound bed. (Active)  05/03/18 1900  Location: Elbow  Location Orientation: Right;Posterior  Staging: Unstageable - Full thickness tissue loss in which the base of the ulcer is covered by slough (yellow, tan, gray, green or brown) and/or eschar (tan, brown or black) in the wound bed.  Wound Description (Comments):   Present on Admission: Yes        Estimated body mass index is 18.28 kg/m as calculated from the following:   Height as of this encounter: 5' 6.5" (1.689 m).   Weight as of this encounter: 52.2 kg.   Consultants: none.  Procedures performed: none.   Disposition: Home Diet recommendation:  Discharge Diet Orders (From admission, onward)     Start     Ordered   12/28/22 0000  Diet - low sodium heart healthy        12/28/22 1240           Regular diet DISCHARGE MEDICATION: Allergies as of 12/28/2022   No Known Allergies      Medication List     STOP taking these medications    diphenhydramine-acetaminophen 25-500 MG Tabs tablet Commonly known as: TYLENOL PM   doxycycline 100 MG capsule Commonly known as: VIBRAMYCIN   mupirocin cream  2 % Commonly known as: BACTROBAN       TAKE these medications    folic acid 1 MG tablet Commonly known as: FOLVITE Take 1 tablet (1 mg total) by mouth daily.   hydrOXYzine 50 MG tablet Commonly known as: ATARAX Take 1 tablet (50 mg total) by mouth 3 (three) times daily as needed.   nicotine 21 mg/24hr patch Commonly known as: NICODERM CQ - dosed in mg/24 hours Place 1 patch (21 mg total) onto the skin daily. Start taking on: December 29, 2022   thiamine 100 MG tablet Commonly known as: Vitamin B-1 Take 1 tablet (100 mg total) by mouth  daily. Start taking on: December 29, 2022        Follow-up Information     [employer] insurance Follow up.   Why: Call your insurance company to get set up with a PCP.  You can also go to the website [email or web address] to try to find a PCP. Contact information: [phone]               Discharge Exam: Ceasar Mons Weights   12/27/22 1214  Weight: 52.2 kg   General exam: Appears calm and comfortable  Respiratory system: Clear to auscultation. Respiratory effort normal. Cardiovascular system: S1 & S2 heard, RRR. No JVD,  Gastrointestinal system: Abdomen is nondistended, soft and nontender.  Central nervous system: Alert and oriented.  Extremities: Symmetric 5 x 5 power. Skin: No rashes, lesions or ulcers Psychiatry:  Mood & affect appropriate.    Condition at discharge: fair  The results of significant diagnostics from this hospitalization (including imaging, microbiology, ancillary and laboratory) are listed below for reference.   Imaging Studies: DG Chest Portable 1 View  Result Date: 12/26/2022 CLINICAL DATA:  Dyspnea EXAM: PORTABLE CHEST 1 VIEW COMPARISON:  03/05/2021 FINDINGS: The heart size and mediastinal contours are within normal limits. Both lungs are clear. The visualized skeletal structures are unremarkable. IMPRESSION: No active disease. Electronically Signed   By: Helyn Numbers M.D.   On: 12/26/2022 17:59    Microbiology: Results for orders placed or performed during the hospital encounter of 12/26/22  SARS Coronavirus 2 by RT PCR (hospital order, performed in [facility] hospital lab) *cepheid single result test* Anterior Nasal Swab     Status: None   Collection Time: 12/26/22  4:39 PM   Specimen: Anterior Nasal Swab  Result Value Ref Range Status   SARS Coronavirus 2 by RT PCR NEGATIVE NEGATIVE Final    Comment: (NOTE) SARS-CoV-2 target nucleic acids are NOT DETECTED.  The SARS-CoV-2 RNA is generally detectable in upper and lower respiratory  specimens during the acute phase of infection. The lowest concentration of SARS-CoV-2 viral copies this assay can detect is 250 copies / mL. A negative result does not preclude SARS-CoV-2 infection and should not be used as the sole basis for treatment or other patient management decisions.  A negative result may occur with improper specimen collection / handling, submission of specimen other than nasopharyngeal swab, presence of viral mutation(s) within the areas targeted by this assay, and inadequate number of viral copies (<250 copies / mL). A negative result must be combined with clinical observations, patient history, and epidemiological information.  Fact Sheet for Patients:   RoadLapTop.co.za  Fact Sheet for Healthcare Providers: http://kim-miller.com/  This test is not yet approved or  cleared by the [place] FDA and has been authorized for detection and/or diagnosis of SARS-CoV-2 by FDA under an Emergency Use Authorization (EUA).  This EUA will remain  in effect (meaning this test can be used) for the duration of the COVID-19 declaration under Section 564(b)(1) of the Act, 21 U.S.C. section 360bbb-3(b)(1), unless the authorization is terminated or revoked sooner.  Performed at [facility], [address] [place], [place] [address]     Labs: CBC: Recent Labs  Lab 12/26/22 1331 12/27/22 1258 12/28/22 0527  WBC 12.6* 4.7 5.1  NEUTROABS  --   --  3.1  HGB 13.4 10.5* 9.1*  HCT 37.8* 31.0* 27.8*  MCV 97.2 101.0* 105.7*  PLT 193 146* 119*   Basic Metabolic Panel: Recent Labs  Lab 12/26/22 1331 12/26/22 2037 12/27/22 0043 12/27/22 0334 12/27/22 1034 12/28/22 0527  NA 134* 129* 132* 133* 132* 133*  K 2.8* 3.6 4.0 3.6 3.9 4.3  CL 94* 90* 95* 95* 97* 102  CO2 13* 26 29 30 27 22   GLUCOSE 107* 141* 137* 68* 98 71  BUN 6 6 6 7 7 8   CREATININE 0.93 0.99 0.85 0.79 0.68 0.64  CALCIUM 7.7* 7.7*  7.9* 7.7* 7.6* 7.2*  MG 1.8  --   --   --   --   --    Liver Function Tests: Recent Labs  Lab 12/26/22 1331  AST 64*  ALT 33  ALKPHOS 135*  BILITOT 3.1*  PROT 6.0*  ALBUMIN 3.0*   CBG: No results for input(s): "GLUCAP" in the last 168 hours.  Discharge time spent: 40 minutes.   Signed: Kathlen Mody, MD [facility] 12/28/2022

## 2022-12-28 NOTE — Plan of Care (Signed)
  Problem: Education: Goal: Knowledge of General Education information will improve Description: Including pain rating scale, medication(s)/side effects and non-pharmacologic comfort measures Outcome: Progressing   Problem: Health Behavior/Discharge Planning: Goal: Ability to manage health-related needs will improve Outcome: Progressing   Problem: Clinical Measurements: Goal: Ability to maintain clinical measurements within normal limits will improve Outcome: Progressing Goal: Will remain free from infection Outcome: Progressing   Problem: Coping: Goal: Level of anxiety will decrease Outcome: Progressing   

## 2022-12-28 NOTE — TOC Transition Note (Signed)
Transition of Care [facility]) - CM/SW Discharge Note   Patient Details  Name: HAYDYN AUBE MRN: [id number] Date of Birth: September 27, 1975  Transition of Care [facility]) CM/SW Contact:  Darleene Cleaver, LCSW Phone Number: 12/28/2022, 1:17 PM   Clinical Narrative:     CSW received consult that patient needed a PCP.  CSW added contact information for patient's insurance company so he can contact them to set up a PCP.   Final next level of care: Home/Self Care Barriers to Discharge: Barriers Resolved   Patient Goals and CMS Choice      Discharge Placement  Patient discharging back home with family.                       Discharge Plan and Services Additional resources added to the After Visit Summary for                                       Social Determinants of Health (SDOH) Interventions SDOH Screenings   Food Insecurity: No Food Insecurity (12/27/2022)  Housing: Medium Risk (12/27/2022)  Transportation Needs: No Transportation Needs (12/27/2022)  Utilities: At Risk (12/27/2022)  Tobacco Use: High Risk (12/26/2022)     Readmission Risk Interventions     No data to display
